# Patient Record
Sex: Female | Born: 1985 | Hispanic: No | Marital: Married | Smoking: Never smoker
Health system: Southern US, Community
[De-identification: ages and names within clinical notes are randomized; demographics above are authoritative.]

## PROBLEM LIST (undated history)

## (undated) ENCOUNTER — Inpatient Hospital Stay (HOSPITAL_COMMUNITY): Payer: Self-pay

## (undated) DIAGNOSIS — D649 Anemia, unspecified: Secondary | ICD-10-CM

## (undated) DIAGNOSIS — F32A Depression, unspecified: Secondary | ICD-10-CM

## (undated) DIAGNOSIS — F329 Major depressive disorder, single episode, unspecified: Secondary | ICD-10-CM

## (undated) DIAGNOSIS — S99919A Unspecified injury of unspecified ankle, initial encounter: Secondary | ICD-10-CM

## (undated) HISTORY — PX: DILATION AND CURETTAGE OF UTERUS: SHX78

---
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
## 2007-01-26 ENCOUNTER — Ambulatory Visit: Payer: Self-pay | Admitting: *Deleted

## 2007-01-26 ENCOUNTER — Inpatient Hospital Stay (HOSPITAL_COMMUNITY): Admission: AD | Admit: 2007-01-26 | Discharge: 2007-01-26 | Payer: Self-pay | Admitting: Obstetrics & Gynecology

## 2007-01-29 ENCOUNTER — Ambulatory Visit: Payer: Self-pay | Admitting: *Deleted

## 2007-01-30 ENCOUNTER — Ambulatory Visit (HOSPITAL_COMMUNITY): Admission: RE | Admit: 2007-01-30 | Discharge: 2007-01-30 | Payer: Self-pay | Admitting: Family Medicine

## 2007-02-11 ENCOUNTER — Ambulatory Visit: Payer: Self-pay | Admitting: Obstetrics & Gynecology

## 2007-02-19 ENCOUNTER — Ambulatory Visit: Payer: Self-pay | Admitting: Family Medicine

## 2007-02-25 ENCOUNTER — Ambulatory Visit: Payer: Self-pay | Admitting: *Deleted

## 2007-03-11 ENCOUNTER — Ambulatory Visit: Payer: Self-pay | Admitting: *Deleted

## 2007-03-13 ENCOUNTER — Ambulatory Visit: Payer: Self-pay | Admitting: Gynecology

## 2007-03-18 ENCOUNTER — Ambulatory Visit: Payer: Self-pay | Admitting: Family Medicine

## 2007-03-18 ENCOUNTER — Inpatient Hospital Stay (HOSPITAL_COMMUNITY): Admission: AD | Admit: 2007-03-18 | Discharge: 2007-03-20 | Payer: Self-pay | Admitting: Family Medicine

## 2008-09-02 ENCOUNTER — Emergency Department (HOSPITAL_COMMUNITY): Admission: EM | Admit: 2008-09-02 | Discharge: 2008-09-02 | Payer: Self-pay | Admitting: Emergency Medicine

## 2009-02-07 ENCOUNTER — Emergency Department (HOSPITAL_COMMUNITY): Admission: EM | Admit: 2009-02-07 | Discharge: 2009-02-08 | Payer: Self-pay | Admitting: Emergency Medicine

## 2009-08-09 ENCOUNTER — Ambulatory Visit: Payer: Self-pay | Admitting: Physician Assistant

## 2009-08-09 ENCOUNTER — Inpatient Hospital Stay (HOSPITAL_COMMUNITY): Admission: AD | Admit: 2009-08-09 | Discharge: 2009-08-10 | Payer: Self-pay | Admitting: Obstetrics and Gynecology

## 2009-11-06 ENCOUNTER — Emergency Department (HOSPITAL_COMMUNITY): Admission: EM | Admit: 2009-11-06 | Discharge: 2009-11-06 | Payer: Self-pay | Admitting: Emergency Medicine

## 2010-04-25 ENCOUNTER — Inpatient Hospital Stay (HOSPITAL_COMMUNITY): Admission: RE | Admit: 2010-04-25 | Discharge: 2010-04-25 | Payer: Self-pay | Admitting: Obstetrics & Gynecology

## 2010-04-25 ENCOUNTER — Ambulatory Visit: Payer: Self-pay | Admitting: Obstetrics and Gynecology

## 2010-07-15 ENCOUNTER — Emergency Department (HOSPITAL_COMMUNITY)
Admission: EM | Admit: 2010-07-15 | Discharge: 2010-07-15 | Payer: Self-pay | Source: Home / Self Care | Admitting: Emergency Medicine

## 2010-07-15 LAB — RPR: RPR Ser Ql: NONREACTIVE

## 2010-07-15 LAB — URINALYSIS, ROUTINE W REFLEX MICROSCOPIC
Bilirubin Urine: NEGATIVE
Ketones, ur: NEGATIVE mg/dL
Nitrite: NEGATIVE
Urobilinogen, UA: 1 mg/dL (ref 0.0–1.0)
pH: 7 (ref 5.0–8.0)

## 2010-07-15 LAB — WET PREP, GENITAL

## 2010-07-15 LAB — POCT PREGNANCY, URINE: Preg Test, Ur: NEGATIVE

## 2010-07-16 LAB — GC/CHLAMYDIA PROBE AMP, GENITAL
Chlamydia, DNA Probe: NEGATIVE
GC Probe Amp, Genital: NEGATIVE

## 2010-08-28 LAB — URINALYSIS, ROUTINE W REFLEX MICROSCOPIC
Bilirubin Urine: NEGATIVE
Ketones, ur: NEGATIVE mg/dL
Nitrite: NEGATIVE
Protein, ur: NEGATIVE mg/dL
Urobilinogen, UA: 0.2 mg/dL (ref 0.0–1.0)

## 2010-08-28 LAB — URINE MICROSCOPIC-ADD ON

## 2010-09-03 LAB — URINALYSIS, ROUTINE W REFLEX MICROSCOPIC
Ketones, ur: NEGATIVE mg/dL
Nitrite: NEGATIVE
Specific Gravity, Urine: 1.024 (ref 1.005–1.030)
pH: 6 (ref 5.0–8.0)

## 2010-09-03 LAB — URINE MICROSCOPIC-ADD ON

## 2010-09-03 LAB — WET PREP, GENITAL: Trich, Wet Prep: NONE SEEN

## 2010-09-03 LAB — PREGNANCY, URINE: Preg Test, Ur: NEGATIVE

## 2010-09-03 LAB — GC/CHLAMYDIA PROBE AMP, GENITAL: Chlamydia, DNA Probe: NEGATIVE

## 2010-09-05 LAB — WET PREP, GENITAL: Clue Cells Wet Prep HPF POC: NONE SEEN

## 2010-09-05 LAB — URINALYSIS, ROUTINE W REFLEX MICROSCOPIC
Glucose, UA: NEGATIVE mg/dL
Leukocytes, UA: NEGATIVE
pH: 6.5 (ref 5.0–8.0)

## 2010-09-05 LAB — URINE CULTURE

## 2010-09-05 LAB — URINE MICROSCOPIC-ADD ON

## 2010-09-22 LAB — URINE MICROSCOPIC-ADD ON

## 2010-09-22 LAB — WET PREP, GENITAL
Trich, Wet Prep: NONE SEEN
WBC, Wet Prep HPF POC: NONE SEEN
Yeast Wet Prep HPF POC: NONE SEEN

## 2010-09-22 LAB — URINALYSIS, ROUTINE W REFLEX MICROSCOPIC
Nitrite: NEGATIVE
Specific Gravity, Urine: 1.033 — ABNORMAL HIGH (ref 1.005–1.030)
Urobilinogen, UA: 1 mg/dL (ref 0.0–1.0)

## 2010-09-22 LAB — POCT PREGNANCY, URINE: Preg Test, Ur: NEGATIVE

## 2010-09-27 LAB — POCT PREGNANCY, URINE: Preg Test, Ur: NEGATIVE

## 2011-03-28 LAB — POCT URINALYSIS DIP (DEVICE)
Bilirubin Urine: NEGATIVE
Glucose, UA: NEGATIVE
Hgb urine dipstick: NEGATIVE
Ketones, ur: NEGATIVE
Operator id: 297281
Specific Gravity, Urine: 1.025

## 2011-03-28 LAB — CBC
Platelets: 248
RDW: 20 — ABNORMAL HIGH
WBC: 17.1 — ABNORMAL HIGH

## 2011-03-28 LAB — VITAMIN B12: Vitamin B-12: 371 (ref 211–911)

## 2011-03-28 LAB — HEMOGLOBINOPATHY EVALUATION: Hgb A2 Quant: 2.2

## 2011-03-28 LAB — FERRITIN: Ferritin: 5 — ABNORMAL LOW (ref 10–291)

## 2011-04-01 LAB — URINALYSIS, ROUTINE W REFLEX MICROSCOPIC
Glucose, UA: NEGATIVE
Protein, ur: NEGATIVE
Specific Gravity, Urine: >1.03 — ABNORMAL HIGH
pH: 6

## 2011-04-01 LAB — GC/CHLAMYDIA PROBE AMP, GENITAL
Chlamydia, DNA Probe: NEGATIVE
GC Probe Amp, Genital: NEGATIVE

## 2011-04-01 LAB — URINE MICROSCOPIC-ADD ON

## 2011-04-01 LAB — POCT URINALYSIS DIP (DEVICE)
Hgb urine dipstick: NEGATIVE
Ketones, ur: 15 — AB
Nitrite: NEGATIVE
Protein, ur: 30 — AB
pH: 6.5

## 2011-04-01 LAB — WET PREP, GENITAL
Clue Cells Wet Prep HPF POC: NONE SEEN
Yeast Wet Prep HPF POC: NONE SEEN

## 2011-04-01 LAB — RAPID URINE DRUG SCREEN, HOSP PERFORMED: Benzodiazepines: NOT DETECTED

## 2011-05-19 ENCOUNTER — Emergency Department (HOSPITAL_COMMUNITY)
Admission: EM | Admit: 2011-05-19 | Discharge: 2011-05-19 | Disposition: A | Discharge status: Home / Self Care | Payer: No Typology Code available for payment source | Attending: Emergency Medicine | Admitting: Emergency Medicine

## 2011-05-19 ENCOUNTER — Encounter: Payer: Self-pay | Admitting: *Deleted

## 2011-05-19 ENCOUNTER — Emergency Department (HOSPITAL_COMMUNITY): Payer: No Typology Code available for payment source

## 2011-05-19 DIAGNOSIS — IMO0001 Reserved for inherently not codable concepts without codable children: Secondary | ICD-10-CM | POA: Insufficient documentation

## 2011-05-19 DIAGNOSIS — S39012A Strain of muscle, fascia and tendon of lower back, initial encounter: Secondary | ICD-10-CM

## 2011-05-19 DIAGNOSIS — M545 Low back pain, unspecified: Secondary | ICD-10-CM | POA: Insufficient documentation

## 2011-05-19 DIAGNOSIS — M2550 Pain in unspecified joint: Secondary | ICD-10-CM | POA: Insufficient documentation

## 2011-05-19 DIAGNOSIS — S139XXA Sprain of joints and ligaments of unspecified parts of neck, initial encounter: Secondary | ICD-10-CM | POA: Insufficient documentation

## 2011-05-19 DIAGNOSIS — S335XXA Sprain of ligaments of lumbar spine, initial encounter: Secondary | ICD-10-CM | POA: Insufficient documentation

## 2011-05-19 DIAGNOSIS — S161XXA Strain of muscle, fascia and tendon at neck level, initial encounter: Secondary | ICD-10-CM

## 2011-05-19 DIAGNOSIS — M542 Cervicalgia: Secondary | ICD-10-CM | POA: Insufficient documentation

## 2011-05-19 MED ORDER — IBUPROFEN 800 MG PO TABS: 800.0000 mg | ORAL_TABLET | Freq: Three times a day (TID) | ORAL | Status: DC

## 2011-05-19 MED ORDER — IBUPROFEN 800 MG PO TABS
800.0000 mg | ORAL_TABLET | Freq: Once | ORAL | Status: AC
  Administered 2011-05-19: 800 mg via ORAL
  Filled 2011-05-19: qty 1

## 2011-05-19 MED ORDER — HYDROCODONE-ACETAMINOPHEN 5-325 MG PO TABS
1.0000 | ORAL_TABLET | Freq: Once | ORAL | Status: AC
  Administered 2011-05-19: 1 via ORAL
  Filled 2011-05-19: qty 1

## 2011-05-19 MED ORDER — HYDROCODONE-ACETAMINOPHEN 5-325 MG PO TABS: 1.0000 | ORAL_TABLET | ORAL | Status: DC | PRN

## 2011-05-19 MED ORDER — DIAZEPAM 5 MG PO TABS: 5.0000 mg | ORAL_TABLET | Freq: Three times a day (TID) | ORAL | Status: DC | PRN

## 2011-05-19 NOTE — ED Notes (Signed)
MD at bedside. 
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
## 2011-05-19 NOTE — ED Notes (Signed)
Patient remains in radiology
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
## 2011-05-19 NOTE — ED Notes (Signed)
Also requesting preg test.

## 2011-05-19 NOTE — ED Notes (Signed)
Pt resting.  Waiting for radiology.
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
## 2011-05-19 NOTE — ED Notes (Signed)
Returned from radiology. 
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
## 2011-05-19 NOTE — ED Provider Notes (Signed)
Medical screening examination/treatment/procedure(s) were performed by non-physician practitioner and as supervising physician I was immediately available for consultation/collaboration.    Nelia Shi, MD 05/19/11 [phone]

## 2011-05-22 ENCOUNTER — Emergency Department (HOSPITAL_COMMUNITY)
Admission: EM | Admit: 2011-05-22 | Discharge: 2011-05-23 | Disposition: A | Discharge status: Home / Self Care | Payer: Self-pay | Attending: Emergency Medicine | Admitting: Emergency Medicine

## 2011-05-22 ENCOUNTER — Encounter (HOSPITAL_COMMUNITY): Payer: Self-pay | Admitting: Emergency Medicine

## 2011-05-22 DIAGNOSIS — R05 Cough: Secondary | ICD-10-CM | POA: Insufficient documentation

## 2011-05-22 DIAGNOSIS — R51 Headache: Secondary | ICD-10-CM | POA: Insufficient documentation

## 2011-05-22 DIAGNOSIS — IMO0001 Reserved for inherently not codable concepts without codable children: Secondary | ICD-10-CM | POA: Insufficient documentation

## 2011-05-22 DIAGNOSIS — H9209 Otalgia, unspecified ear: Secondary | ICD-10-CM | POA: Insufficient documentation

## 2011-05-22 DIAGNOSIS — J069 Acute upper respiratory infection, unspecified: Secondary | ICD-10-CM | POA: Insufficient documentation

## 2011-05-22 DIAGNOSIS — M549 Dorsalgia, unspecified: Secondary | ICD-10-CM | POA: Insufficient documentation

## 2011-05-22 DIAGNOSIS — R599 Enlarged lymph nodes, unspecified: Secondary | ICD-10-CM | POA: Insufficient documentation

## 2011-05-22 DIAGNOSIS — R059 Cough, unspecified: Secondary | ICD-10-CM | POA: Insufficient documentation

## 2011-05-22 DIAGNOSIS — R070 Pain in throat: Secondary | ICD-10-CM | POA: Insufficient documentation

## 2011-05-22 DIAGNOSIS — R509 Fever, unspecified: Secondary | ICD-10-CM | POA: Insufficient documentation

## 2011-05-22 NOTE — ED Notes (Signed)
Pt st's she started feeling bad last pm. With elevated temp. And body aches

## 2011-05-23 MED ORDER — AMOXICILLIN 500 MG PO CAPS: 500.0000 mg | ORAL_CAPSULE | Freq: Three times a day (TID) | ORAL | Status: AC

## 2011-05-23 MED ORDER — OXYCODONE-ACETAMINOPHEN 5-325 MG PO TABS: 2.0000 | ORAL_TABLET | ORAL | Status: AC | PRN

## 2011-05-23 NOTE — ED Notes (Signed)
PT ambulated with a steady gait; VSS; no acute signs of distress; pt education on infection control and contagiousness. No questions from pt.
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
## 2011-05-23 NOTE — ED Notes (Signed)
MD aware of abnormal vitals. No change in orders. PT to be discharge. Pt encouraged to take OTC fever reducers.

## 2011-06-18 NOTE — L&D Delivery Note (Signed)
Delivery Note At 3:55 PM a viable female was delivered via Vaginal, Spontaneous Delivery (Presentation: Left Occiput Anterior).  APGAR: 9, 9; weight .   Placenta status: Intact, Spontaneous.  Cord: 3 vessels with the following complications: None.    Anesthesia: Epidural  Episiotomy: None Lacerations: 1st degree;Perineal Suture Repair:hemostastsic, no repairEst. Blood Loss (mL):   Mom to postpartum.  Baby to nursery-stable.  Carlis Blanchard 04/25/2012, 4:12 PM

## 2011-08-28 ENCOUNTER — Inpatient Hospital Stay (HOSPITAL_COMMUNITY)
Admission: AD | Admit: 2011-08-28 | Discharge: 2011-08-28 | Disposition: A | Discharge status: Home / Self Care | Payer: Self-pay | Source: Ambulatory Visit | Attending: Obstetrics & Gynecology | Admitting: Obstetrics & Gynecology

## 2011-08-28 ENCOUNTER — Inpatient Hospital Stay (HOSPITAL_COMMUNITY): Payer: Self-pay

## 2011-08-28 ENCOUNTER — Encounter (HOSPITAL_COMMUNITY): Payer: Self-pay

## 2011-08-28 DIAGNOSIS — B9689 Other specified bacterial agents as the cause of diseases classified elsewhere: Secondary | ICD-10-CM | POA: Insufficient documentation

## 2011-08-28 DIAGNOSIS — B373 Candidiasis of vulva and vagina: Secondary | ICD-10-CM

## 2011-08-28 DIAGNOSIS — O2390 Unspecified genitourinary tract infection in pregnancy, unspecified trimester: Secondary | ICD-10-CM | POA: Insufficient documentation

## 2011-08-28 DIAGNOSIS — Z3201 Encounter for pregnancy test, result positive: Secondary | ICD-10-CM

## 2011-08-28 DIAGNOSIS — Z8619 Personal history of other infectious and parasitic diseases: Secondary | ICD-10-CM | POA: Clinically undetermined

## 2011-08-28 DIAGNOSIS — N760 Acute vaginitis: Secondary | ICD-10-CM | POA: Insufficient documentation

## 2011-08-28 DIAGNOSIS — R109 Unspecified abdominal pain: Secondary | ICD-10-CM | POA: Insufficient documentation

## 2011-08-28 DIAGNOSIS — A499 Bacterial infection, unspecified: Secondary | ICD-10-CM | POA: Insufficient documentation

## 2011-08-28 HISTORY — DX: Depression, unspecified: F32.A

## 2011-08-28 HISTORY — DX: Major depressive disorder, single episode, unspecified: F32.9

## 2011-08-28 LAB — URINE MICROSCOPIC-ADD ON

## 2011-08-28 LAB — CBC
HCT: 33.6 % — ABNORMAL LOW (ref 36.0–46.0)
MCH: 27.3 pg (ref 26.0–34.0)
MCV: 84.8 fL (ref 78.0–100.0)
Platelets: 313 10*3/uL (ref 150–400)
RDW: 14.6 % (ref 11.5–15.5)

## 2011-08-28 LAB — URINALYSIS, ROUTINE W REFLEX MICROSCOPIC
Glucose, UA: NEGATIVE mg/dL
Nitrite: NEGATIVE
Specific Gravity, Urine: 1.01 (ref 1.005–1.030)
pH: 6.5 (ref 5.0–8.0)

## 2011-08-28 LAB — POCT PREGNANCY, URINE: Preg Test, Ur: POSITIVE — AB

## 2011-08-28 LAB — WET PREP, GENITAL
Trich, Wet Prep: NONE SEEN
Yeast Wet Prep HPF POC: NONE SEEN

## 2011-08-28 MED ORDER — NYSTATIN 100000 UNIT/GM EX CREA
TOPICAL_CREAM | Freq: Two times a day (BID) | CUTANEOUS | Status: DC
  Administered 2011-08-28: 20:00:00 via TOPICAL
  Filled 2011-08-28: qty 15

## 2011-08-28 MED ORDER — NYSTATIN-TRIAMCINOLONE 100000-0.1 UNIT/GM-% EX CREA: TOPICAL_CREAM | Freq: Two times a day (BID) | CUTANEOUS | Status: DC

## 2011-08-28 MED ORDER — METRONIDAZOLE 500 MG PO TABS: 2000.0000 mg | ORAL_TABLET | Freq: Once | ORAL | Status: AC

## 2011-08-28 MED ORDER — TERCONAZOLE 0.4 % VA CREA: 1.0000 | TOPICAL_CREAM | Freq: Every day | VAGINAL | Status: AC

## 2011-08-28 MED ORDER — TRIAMCINOLONE ACETONIDE 0.1 % EX CREA
TOPICAL_CREAM | Freq: Two times a day (BID) | CUTANEOUS | Status: DC
  Administered 2011-08-28: 20:00:00 via TOPICAL
  Filled 2011-08-28: qty 15

## 2011-08-28 NOTE — MAU Note (Signed)
Pt states lmp-07/22/2011, has been nauseated, notes intermittent abdominal pain. White vaginal d/c, feels irritated. Was recently treated for chlamydia 2 weeks ago, partner was treated. Vagina feels irritated, and slightly swollen.

## 2011-08-28 NOTE — Discharge Instructions (Signed)
Bacterial Vaginosis Bacterial vaginosis (BV) is a vaginal infection where the normal balance of bacteria in the vagina is disrupted. The normal balance is then replaced by an overgrowth of certain bacteria. There are several different kinds of bacteria that can cause BV. BV is the most common vaginal infection in women of childbearing age. CAUSES   The cause of BV is not fully understood. BV develops when there is an increase or imbalance of harmful bacteria.   Some activities or behaviors can upset the normal balance of bacteria in the vagina and put women at increased risk including:   Having a new sex partner or multiple sex partners.   Douching.   Using an intrauterine device (IUD) for contraception.   It is not clear what role sexual activity plays in the development of BV. However, women that have never had sexual intercourse are rarely infected with BV.  Women do not get BV from toilet seats, bedding, swimming pools or from touching objects around them.  SYMPTOMS   Grey vaginal discharge.   A fish-like odor with discharge, especially after sexual intercourse.   Itching or burning of the vagina and vulva.   Burning or pain with urination.   Some women have no signs or symptoms at all.  DIAGNOSIS  Your caregiver must examine the vagina for signs of BV. Your caregiver will perform lab tests and look at the sample of vaginal fluid through a microscope. They will look for bacteria and abnormal cells (clue cells), a pH test higher than 4.5, and a positive amine test all associated with BV.  RISKS AND COMPLICATIONS   Pelvic inflammatory disease (PID).   Infections following gynecology surgery.   Developing HIV.   Developing herpes virus.  TREATMENT  Sometimes BV will clear up without treatment. However, all women with symptoms of BV should be treated to avoid complications, especially if gynecology surgery is planned. Female partners generally do not need to be treated. However,  BV may spread between female sex partners so treatment is helpful in preventing a recurrence of BV.   BV may be treated with antibiotics. The antibiotics come in either pill or vaginal cream forms. Either can be used with nonpregnant or pregnant women, but the recommended dosages differ. These antibiotics are not harmful to the baby.   BV can recur after treatment. If this happens, a second round of antibiotics will often be prescribed.   Treatment is important for pregnant women. If not treated, BV can cause a premature delivery, especially for a pregnant woman who had a premature birth in the past. All pregnant women who have symptoms of BV should be checked and treated.   For chronic reoccurrence of BV, treatment with a type of prescribed gel vaginally twice a week is helpful.  HOME CARE INSTRUCTIONS   Finish all medication as directed by your caregiver.   Do not have sex until treatment is completed.   Tell your sexual partner that you have a vaginal infection. They should see their caregiver and be treated if they have problems, such as a mild rash or itching.   Practice safe sex. Use condoms. Only have 1 sex partner.  PREVENTION  Basic prevention steps can help reduce the risk of upsetting the natural balance of bacteria in the vagina and developing BV:  Do not have sexual intercourse (be abstinent).   Do not douche.   Use all of the medicine prescribed for treatment of BV, even if the signs and symptoms go away.     Tell your sex partner if you have BV. That way, they can be treated, if needed, to prevent reoccurrence.  SEEK MEDICAL CARE IF:   Your symptoms are not improving after 3 days of treatment.   You have increased discharge, pain, or fever.  MAKE SURE YOU:   Understand these instructions.   Will watch your condition.   Will get help right away if you are not doing well or get worse.  FOR MORE INFORMATION  Division of STD Prevention (DSTDP), Centers for Disease  Control and Prevention: SolutionApps.co.za American [employer] Health Association ([place]): www.ashastd.org  Document Released: 06/03/2005 Document Revised: 05/23/2011 Document Reviewed: 11/24/2008 [facility] Patient Information 2012 [place], [place].Candida Infection, Adult A candida infection (also called yeast, fungus and Monilia infection) is an overgrowth of yeast that can occur anywhere on the body. A yeast infection commonly occurs in warm, moist body areas. Usually, the infection remains localized but can spread to become a systemic infection. A yeast infection may be a sign of a more severe disease such as diabetes, leukemia, or AIDS. A yeast infection can occur in both men and women. In women, Candida vaginitis is a vaginal infection. It is one of the most common causes of vaginitis. Men usually do not have symptoms or know they have an infection until other problems develop. Men may find out they have a yeast infection because their sex partner has a yeast infection. Uncircumcised men are more likely to get a yeast infection than circumcised men. This is because the uncircumcised glans is not exposed to air and does not remain as dry as that of a circumcised glans. Older adults may develop yeast infections around dentures. CAUSES  Women  Antibiotics.   Steroid medication taken for a long time.   Being overweight (obese).   Diabetes.   Poor immune condition.   Certain serious medical conditions.   Immune suppressive medications for organ transplant patients.   Chemotherapy.   Pregnancy.   Menstration.   Stress and fatigue.   Intravenous drug use.   Oral contraceptives.   Wearing tight-fitting clothes in the crotch area.   Catching it from a sex partner who has a yeast infection.   Spermicide.   Intravenous, urinary, or other catheters.  Men  Catching it from a sex partner who has a yeast infection.   Having oral or anal sex with a person who has the infection.    Spermicide.   Diabetes.   Antibiotics.   Poor immune system.   Medications that suppress the immune system.   Intravenous drug use.   Intravenous, urinary, or other catheters.  SYMPTOMS  Women  Thick, white vaginal discharge.   Vaginal itching.   Redness and swelling in and around the vagina.   Irritation of the lips of the vagina and perineum.   Blisters on the vaginal lips and perineum.   Painful sexual intercourse.   Low blood sugar (hypoglycemia).   Painful urination.   Bladder infections.   Intestinal problems such as constipation, indigestion, bad breath, bloating, increase in gas, diarrhea, or loose stools.  Men  Men may develop intestinal problems such as constipation, indigestion, bad breath, bloating, increase in gas, diarrhea, or loose stools.   Dry, cracked skin on the penis with itching or discomfort.   Jock itch.   Dry, flaky skin.   Athlete's foot.   Hypoglycemia.  DIAGNOSIS  Women  A history and an exam are performed.   The discharge may be examined under a microscope.  A culture may be taken of the discharge.  Men  A history and an exam are performed.   Any discharge from the penis or areas of cracked skin will be looked at under the microscope and cultured.   Stool samples may be cultured.  TREATMENT  Women  Vaginal antifungal suppositories and creams.   Medicated creams to decrease irritation and itching on the outside of the vagina.   Warm compresses to the perineal area to decrease swelling and discomfort.   Oral antifungal medications.   Medicated vaginal suppositories or cream for repeated or recurrent infections.   Wash and dry the irritation areas before applying the cream.   Eating yogurt with lactobacillus may help with prevention and treatment.   Sometimes painting the vagina with gentian violet solution may help if creams and suppositories do not work.  Men  Antifungal creams and oral antifungal  medications.   Sometimes treatment must continue for 30 days after the symptoms go away to prevent recurrence.  HOME CARE INSTRUCTIONS  Women  Use cotton underwear and avoid tight-fitting clothing.   Avoid colored, scented toilet paper and deodorant tampons or pads.   Do not douche.   Keep your diabetes under control.   Finish all the prescribed medications.   Keep your skin clean and dry.   Consume milk or yogurt with lactobacillus active culture regularly. If you get frequent yeast infections and think that is what the infection is, there are over-the-counter medications that you can get. If the infection does not show healing in 3 days, talk to your caregiver.   Tell your sex partner you have a yeast infection. Your partner may need treatment also, especially if your infection does not clear up or recurs.  Men  Keep your skin clean and dry.   Keep your diabetes under control.   Finish all prescribed medications.   Tell your sex partner that you have a yeast infection so they can be treated if necessary.  SEEK MEDICAL CARE IF:   Your symptoms do not clear up or worsen in one week after treatment.   You have an oral temperature above 102 F (38.9 C).   You have trouble swallowing or eating for a prolonged time.   You develop blisters on and around your vagina.   You develop vaginal bleeding and it is not your menstrual period.   You develop abdominal pain.   You develop intestinal problems as mentioned above.   You get weak or lightheaded.   You have painful or increased urination.   You have pain during sexual intercourse.  MAKE SURE YOU:   Understand these instructions.   Will watch your condition.   Will get help right away if you are not doing well or get worse.  Document Released: 07/11/2004 Document Revised: 05/23/2011 Document Reviewed: 10/23/2009 [facility] Patient Information 2012 [place], [place].

## 2011-08-28 NOTE — MAU Note (Signed)
Patient states she has missed her period and thinks she might be pregnancy. Has been having a vaginal discharge with itching for 4-5 days, lot of vaginal irritation. Has some lower abdominal pain the comes and goes.

## 2011-08-28 NOTE — MAU Provider Note (Signed)
  History     CSN: [id number]  Arrival date and time: 08/28/11 1540   First Provider Initiated Contact with Patient 08/28/11 1701      Chief Complaint  Patient presents with  . Vaginal Discharge  . Abdominal Pain   HPI This is a 26 y.o. at [redacted]w[redacted]d who presents with c/o cramping for several days and spotting yesterday. Was treated for Chlamydia 2 weeks ago at [facility]. Was not sure if she was pregnant. No fever, nausea, vomiting or diarrhea.  OB History    Grav Para Term Preterm Abortions TAB SAB Ect Mult Living   7 2 2  4 4    2       Past Medical History  Diagnosis Date  . Depression     Past Surgical History  Procedure Date  . Dilation and curettage of uterus     Family History  Problem Relation Age of Onset  . Anesthesia problems Neg Hx   . Hypotension Neg Hx   . Malignant hyperthermia Neg Hx   . Pseudochol deficiency Neg Hx     History  Substance Use Topics  . Smoking status: Current Everyday Smoker    Types: Cigars  . Smokeless tobacco: Never Used  . Alcohol Use: No     occ    Allergies: No Known Allergies  Prescriptions prior to admission  Medication Sig Dispense Refill  . Multiple Vitamins-Minerals (MULTIVITAMIN GUMMIES ADULT) CHEW Chew 2 tablets by mouth daily.        ROS As above   Physical Exam   Blood pressure 125/80, pulse 76, temperature 100.1 F (37.8 C), temperature source Oral, resp. rate 16, height 5\' 3"  (1.6 m), weight 189 lb 3.2 oz (85.821 kg), last menstrual period 07/22/2011, SpO2 100.00%.  Physical Exam  Constitutional: She is oriented to person, place, and time. She appears well-developed and well-nourished. No distress.  HENT:  Head: Normocephalic.  Cardiovascular: Normal rate.   Respiratory: Effort normal.  GI: Soft. She exhibits no distension and no mass. There is tenderness (suprapubic). There is no rebound and no guarding.  Genitourinary: Uterus normal. Vaginal discharge (curdlike,, white, no blood in vault) found.   Uterus small, tender  Musculoskeletal: Normal range of motion.  Neurological: She is alert and oriented to person, place, and time.  Skin: Skin is warm and dry.  Psychiatric: She has a normal mood and affect.    MAU Course  Procedures  MDM Will check quant, ABO/RH, CBC and [place]. Will check wet prep but not cultures since it has only been 2 weeks since treatment.  Assessment and Plan  A:  Intrauterine gestational sac at 5.0 weeks, no yolk sac or fp, so cannot exclude ectopic yet      Mild BV      Neg wet prep for yeast, but based on symptoms and appearance of discharge, assume yeast P:  Repeat quant in 2 days, repeat US in 7- 10 days per Radiologist      Will treat BV and yeast  [facility] 08/28/2011, 5:07 PM

## 2011-08-29 NOTE — MAU Provider Note (Signed)
Attestation of Attending Supervision of Advanced Practitioner: Evaluation and management procedures were performed by the PA/NP/CNM/OB Fellow under my supervision/collaboration. Chart reviewed, and agree with management and plan.  Jaynie Collins, M.D. 08/29/2011 9:01 AM

## 2011-09-04 ENCOUNTER — Encounter (HOSPITAL_COMMUNITY): Payer: Self-pay

## 2011-09-04 ENCOUNTER — Inpatient Hospital Stay (HOSPITAL_COMMUNITY)
Admission: AD | Admit: 2011-09-04 | Discharge: 2011-09-04 | Disposition: A | Discharge status: Home / Self Care | Payer: Self-pay | Source: Ambulatory Visit | Attending: Obstetrics and Gynecology | Admitting: Obstetrics and Gynecology

## 2011-09-04 ENCOUNTER — Inpatient Hospital Stay (HOSPITAL_COMMUNITY): Payer: Self-pay

## 2011-09-04 DIAGNOSIS — Z8619 Personal history of other infectious and parasitic diseases: Secondary | ICD-10-CM

## 2011-09-04 DIAGNOSIS — O30009 Twin pregnancy, unspecified number of placenta and unspecified number of amniotic sacs, unspecified trimester: Secondary | ICD-10-CM | POA: Insufficient documentation

## 2011-09-04 DIAGNOSIS — O99891 Other specified diseases and conditions complicating pregnancy: Secondary | ICD-10-CM | POA: Insufficient documentation

## 2011-09-04 NOTE — Discharge Instructions (Signed)
Multiple Pregnancy A multiple pregnancy is when a woman is pregnant with two or more fetuses. Multiple pregnancies occur in about 3% of all births. The more babies in a pregnancy, the greater the risks of problems to the babies and mother. This includes death. Since the use of Assisted Reproductive Technology (ART) and medications that can induce ovulation, multiple fetal gestation has increased.  RISKS TO THE MOTHER  Preeclampsia and eclampsia.   Postpartum bleeding (hemorrhage).   Kidney infection (pyelonephritis).   Develop anemia.   Develop diabetes.   Liver complications.   A blood clot blocks the artery, or branch of the artery leading to the lungs (pulmonary embolism).   Blood clots in the leg.   Placental separation.   Higher rate of Cesarean Section deliveries.   Women over 22 years old have a higher rate of Downs Syndrome babies.  RISKS TO THE BABY  Preterm labor with a premature baby.   Very low birth weight babies that are less than 3 pounds, especially with triplets or mores.   Premature rupture of the membranes.   Twin to twin blood transfusion with one baby anemic and the other baby with too much blood in its system. There may also be heart failure.   With triplets or more, one of the babies is at high risk for cerebral palsy or other neurologic problem.   There is a higher incidence of fetal death.  CARE OF MOTHERS WITH MULTIPLE FETAL GESTATION Multiple pregnancies need more care and special prenatal care.  You will see your caregiver more often.   You will have more tests including ultrasounds, nonstress tests and blood tests.   You will have special tests done called amniocentesis and a biophysical profile.   You may be hospitalized more often during the pregnancy.   You will be encouraged to eat a balanced and healthy diet with vitamin and mineral supplements as directed.   You will be asked to get more rest and sleep to keep up your energy.    You will be asked to restrict your daily activities, exercise, work, household chores and sexual activity.   If you have preterm labor with small babies, you will be given a steroid injection to help the babies lungs mature and do better when born.   The delivery may have to be by Cesarean delivery, especially if there are triplets or more.   The delivery should be in a hospital with an intensive care nursery and Neonatologists (pediatrician for high risk babies) to care for the newborn babies.  HOME CARE INSTRUCTIONS   Follow the caregiver's recommendations regarding office visits, tests for you and the babies, diet, rest and medications.   Avoid a large amount of physical activity.   Arrange to have help after the babies are born and when you go home from the hospital.   Take classes on how to care for multiple babies before you deliver them.  SEEK IMMEDIATE MEDICAL CARE IF:   You develop a temperature of 102 F (38.9 C) or higher.   You are leaking fluid from the vagina.   You develop vaginal bleeding.   You develop uterine contractions.   You develop a severe headache, severe upper abdominal pain, visual problems or excessive swelling of your face, hands and feet.   You develop severe back pain or leg pain.   You develop severe tiredness.   You develop chest pain.   You have shortness of breath, fall down or pass out.  well or get worse.  Document Released: 03/12/2008 Document Revised: 05/23/2011 Document Reviewed: 03/12/2008 [employer] Patient Information 2012 [employer]. 

## 2011-09-04 NOTE — MAU Provider Note (Signed)
History   Pt presents today for repeat B-quant and [place] to confirm IUP. She denies any bleeding or pain at this time. She states she does continue to have slight cramping sensations that "come and go."  CSN: [id number]  Arrival date and time: 09/04/11 1103   None     Chief Complaint  Patient presents with  . Pregnancy Ultrasound   HPI  OB History    Grav Para Term Preterm Abortions TAB SAB Ect Mult Living   7 2 2  4 4    2       Past Medical History  Diagnosis Date  . Depression     Past Surgical History  Procedure Date  . Dilation and curettage of uterus     Family History  Problem Relation Age of Onset  . Anesthesia problems Neg Hx   . Hypotension Neg Hx   . Malignant hyperthermia Neg Hx   . Pseudochol deficiency Neg Hx     History  Substance Use Topics  . Smoking status: Current Everyday Smoker    Types: Cigars  . Smokeless tobacco: Never Used  . Alcohol Use: No     occ    Allergies: No Known Allergies  Prescriptions prior to admission  Medication Sig Dispense Refill  . metroNIDAZOLE (FLAGYL) 500 MG tablet Take 4 tablets (2,000 mg total) by mouth once.  4 tablet  0  . Multiple Vitamins-Minerals (MULTIVITAMIN GUMMIES ADULT) CHEW Chew 2 tablets by mouth daily.      Marland Kitchen terconazole (TERAZOL 7) 0.4 % vaginal cream Place 1 applicator vaginally at bedtime.  45 g  0    Review of Systems  Constitutional: Negative for fever and chills.  Eyes: Negative for blurred vision and double vision.  Respiratory: Negative for cough, hemoptysis, sputum production, shortness of breath and wheezing.   Cardiovascular: Negative for chest pain and palpitations.  Gastrointestinal: Negative for nausea, vomiting, abdominal pain, diarrhea and constipation.  Genitourinary: Negative for dysuria, urgency, frequency and hematuria.  Neurological: Negative for dizziness and headaches.  Psychiatric/Behavioral: Negative for depression and suicidal ideas.   Physical Exam   Blood pressure  122/72, pulse 102, temperature 98.3 F (36.8 C), temperature source Oral, resp. rate 16, height 5\' 3"  (1.6 m), weight 190 lb (86.183 kg), last menstrual period 07/22/2011, SpO2 100.00%.  Physical Exam  Nursing note and vitals reviewed. Constitutional: She is oriented to person, place, and time. She appears well-developed and well-nourished. No distress.  HENT:  Head: Normocephalic and atraumatic.  Eyes: EOM are normal. Pupils are equal, round, and reactive to light.  GI: Soft. She exhibits no distension. There is no tenderness. There is no rebound and no guarding.  Neurological: She is alert and oriented to person, place, and time.  Skin: Skin is warm and dry. She is not diaphoretic.  Psychiatric: She has a normal mood and affect. Her behavior is normal. Judgment and thought content normal.    MAU Course  Procedures  Results for orders placed during the hospital encounter of 09/04/11 (from the past 72 hour(s))  HCG, QUANTITATIVE, PREGNANCY     Status: Abnormal   Collection Time   09/04/11 11:40 AM      Component Value Range Comment   hCG, Beta Chain, Quant, S 30140 (*) <5 (mIU/mL)    US shows dichorionic, diamniotic intrauterine twin preg. Fetus A measures 6.2 wks with cardiac activity. Gestational sac B has a yolk sac but no visible embryo at this time.  Assessment and Plan  Twin preg:  discussed with pt at length. She will return in 2wks for repeat US to confirm twin viability. Discussed diet, activity, risks, and precautions.  Clinton Gallant. Shareena Nusz III, DrHSc, MPAS, PA-C  09/04/2011, 1:31 PM

## 2011-09-04 NOTE — MAU Note (Signed)
Pt states here for repeat bhcg and [place], denies pain or bleeding. Was to return on 08/30/2011, however was unable to.

## 2011-09-05 NOTE — MAU Provider Note (Signed)
Agree with above note.  Lauren Hayden 09/05/2011 8:59 AM

## 2011-09-18 ENCOUNTER — Inpatient Hospital Stay (HOSPITAL_COMMUNITY): Payer: Self-pay

## 2011-09-18 ENCOUNTER — Inpatient Hospital Stay (HOSPITAL_COMMUNITY)
Admission: AD | Admit: 2011-09-18 | Discharge: 2011-09-18 | Disposition: A | Discharge status: Home / Self Care | Payer: Self-pay | Source: Ambulatory Visit | Attending: Obstetrics & Gynecology | Admitting: Obstetrics & Gynecology

## 2011-09-18 DIAGNOSIS — O99891 Other specified diseases and conditions complicating pregnancy: Secondary | ICD-10-CM | POA: Insufficient documentation

## 2011-09-18 DIAGNOSIS — Z3490 Encounter for supervision of normal pregnancy, unspecified, unspecified trimester: Secondary | ICD-10-CM

## 2011-09-18 DIAGNOSIS — Z8619 Personal history of other infectious and parasitic diseases: Secondary | ICD-10-CM

## 2011-09-18 DIAGNOSIS — Z1389 Encounter for screening for other disorder: Secondary | ICD-10-CM

## 2011-09-18 NOTE — MAU Note (Signed)
Patient to MAU for a follow up ultrasound to confirm twin gestation. Patient states she has some cramping, no bleeding or discharge.

## 2011-09-18 NOTE — MAU Provider Note (Signed)
  History     CSN: [id number]  Arrival date and time: 09/18/11 1113   First Provider Initiated Contact with Patient 09/18/11 1243  Client not in lobby when provider called her.    Chief Complaint  Patient presents with  . Follow-up   HPI Lauren Hayden 26 y.o. 8w 2d gestation.  Returns today for repeat ultrasound.  Identified 2 gestational sacs at ultrasound 2 weeks ago.  One with fetal pole and one with just a yolk sac.  OB History    Grav Para Term Preterm Abortions TAB SAB Ect Mult Living   7 2 2  4 4    2       Past Medical History  Diagnosis Date  . Depression     Past Surgical History  Procedure Date  . Dilation and curettage of uterus     Family History  Problem Relation Age of Onset  . Anesthesia problems Neg Hx   . Hypotension Neg Hx   . Malignant hyperthermia Neg Hx   . Pseudochol deficiency Neg Hx     History  Substance Use Topics  . Smoking status: Current Everyday Smoker    Types: Cigars  . Smokeless tobacco: Never Used  . Alcohol Use: No     occ    Allergies: No Known Allergies  Prescriptions prior to admission  Medication Sig Dispense Refill  . Multiple Vitamins-Minerals (MULTIVITAMIN GUMMIES ADULT) CHEW Chew 2 tablets by mouth daily.        Review of Systems  Gastrointestinal: Negative for abdominal pain.  Genitourinary:       No vaginal bleeding   Physical Exam   Blood pressure 129/63, pulse 78, temperature 98.6 F (37 C), temperature source Oral, resp. rate 16, last menstrual period 07/22/2011, SpO2 100.00%.  Physical Exam  Nursing note and vitals reviewed. Constitutional: She is oriented to person, place, and time. She appears well-developed and well-nourished.  HENT:  Head: Normocephalic.  Eyes: EOM are normal.  Musculoskeletal: Normal range of motion.  Neurological: She is alert and oriented to person, place, and time.  Skin: Skin is warm and dry.  Psychiatric: She has a normal mood and affect.    MAU Course   Procedures Ultrasound - Progression of Fetus A appropriate growth, Second sac continues to only have yolk sac.  MDM 1245  Consult with Dr. Debroah Loop re: plan of care - likely will be a single baby, begin prenatal care  Assessment and Plan  Pregnancy 8w 2d   Plan Begin prenatal care Client not in lobby to discuss ultrasound findings.  Will discuss if she returns. 1330  Client in lobby.  Reviewed findings of ultrasound Advised to begin prenatal care as soon as possible. Plans to apply for Medicaid today - has pregnancy verification form. Given list of providers for prenatal care.   Lauren Hayden 09/18/2011, 12:43 PM

## 2011-09-26 NOTE — MAU Provider Note (Signed)
Agree with note. 

## 2011-10-09 ENCOUNTER — Inpatient Hospital Stay (HOSPITAL_COMMUNITY)
Admission: AD | Admit: 2011-10-09 | Discharge: 2011-10-09 | Disposition: A | Discharge status: Home / Self Care | Payer: Self-pay | Source: Ambulatory Visit | Attending: Obstetrics and Gynecology | Admitting: Obstetrics and Gynecology

## 2011-10-09 ENCOUNTER — Encounter (HOSPITAL_COMMUNITY): Payer: Self-pay | Admitting: *Deleted

## 2011-10-09 DIAGNOSIS — O99891 Other specified diseases and conditions complicating pregnancy: Secondary | ICD-10-CM | POA: Insufficient documentation

## 2011-10-09 DIAGNOSIS — O26899 Other specified pregnancy related conditions, unspecified trimester: Secondary | ICD-10-CM

## 2011-10-09 DIAGNOSIS — R109 Unspecified abdominal pain: Secondary | ICD-10-CM | POA: Insufficient documentation

## 2011-10-09 LAB — URINALYSIS, ROUTINE W REFLEX MICROSCOPIC
Bilirubin Urine: NEGATIVE
Glucose, UA: NEGATIVE mg/dL
Protein, ur: NEGATIVE mg/dL
Specific Gravity, Urine: 1.015 (ref 1.005–1.030)
Urobilinogen, UA: 0.2 mg/dL (ref 0.0–1.0)

## 2011-10-09 LAB — WET PREP, GENITAL
Clue Cells Wet Prep HPF POC: NONE SEEN
Trich, Wet Prep: NONE SEEN

## 2011-10-09 LAB — URINE MICROSCOPIC-ADD ON

## 2011-10-09 NOTE — MAU Provider Note (Signed)
History     CSN: [id number]  Arrival date and time: 10/09/11 2956   First Provider Initiated Contact with Patient 10/09/11 2058      Chief Complaint  Patient presents with  . Dysmenorrhea   HPI 26 y.o. [id number] at [redacted]w[redacted]d c/o cramping. No bleeding. Concerned she is pregnant with twins - first u/s this pregnancy showed 2 IUGS, second u/s 3 weeks ago showed one fetal pole with heartbeat and 1 GS with yolk sac only. Has not started prenatal care yet.    Past Medical History  Diagnosis Date  . Depression     Past Surgical History  Procedure Date  . Dilation and curettage of uterus     Family History  Problem Relation Age of Onset  . Anesthesia problems Neg Hx   . Hypotension Neg Hx   . Malignant hyperthermia Neg Hx   . Pseudochol deficiency Neg Hx     History  Substance Use Topics  . Smoking status: Current Everyday Smoker    Types: Cigars  . Smokeless tobacco: Never Used  . Alcohol Use: No     occ    Allergies: No Known Allergies  Prescriptions prior to admission  Medication Sig Dispense Refill  . ibuprofen (ADVIL,MOTRIN) 200 MG tablet Take 200 mg by mouth every 6 (six) hours as needed. Takes for pain      . Prenatal Vit-Fe Fumarate-FA (PRENATAL MULTIVITAMIN) TABS Take 1 tablet by mouth daily.        Review of Systems  Constitutional: Negative.   Respiratory: Negative.   Cardiovascular: Negative.   Gastrointestinal: Positive for abdominal pain. Negative for nausea, vomiting, diarrhea and constipation.  Genitourinary: Negative for dysuria, urgency, frequency, hematuria and flank pain.       Negative for vaginal bleeding, vaginal discharge  Musculoskeletal: Negative.   Neurological: Negative.   Psychiatric/Behavioral: Negative.    Physical Exam   Blood pressure 112/66, pulse 96, temperature 97.5 F (36.4 C), temperature source Oral, resp. rate 16, height 5\' 3"  (1.6 m), weight 193 lb 6.4 oz (87.726 kg), last menstrual period 07/22/2011.  Physical Exam    Constitutional: She is oriented to person, place, and time. She appears well-developed and well-nourished. No distress.  HENT:  Head: Normocephalic and atraumatic.  Cardiovascular: Normal rate and regular rhythm.   Respiratory: Effort normal. No respiratory distress.  GI: Soft. She exhibits no distension and no mass. There is no tenderness. There is no rebound and no guarding.  Genitourinary: There is no rash or lesion on the right labia. There is no rash or lesion on the left labia. Uterus is not deviated, not enlarged, not fixed and not tender. Cervix exhibits no motion tenderness, no discharge and no friability. Right adnexum displays no mass, no tenderness and no fullness. Left adnexum displays no mass, no tenderness and no fullness. No erythema, tenderness or bleeding around the vagina. Vaginal discharge (white) found.  Neurological: She is alert and oriented to person, place, and time.  Skin: Skin is warm and dry.  Psychiatric: She has a normal mood and affect.   Bedside u/s shows 1 fetus with + FHR, no evidence of twin pregnancy MAU Course  Procedures  Results for orders placed during the hospital encounter of 10/09/11 (from the past 24 hour(s))  URINALYSIS, ROUTINE W REFLEX MICROSCOPIC     Status: Abnormal   Collection Time   10/09/11  8:22 PM      Component Value Range   Color, Urine YELLOW  YELLOW    APPearance  HAZY (*) CLEAR    Specific Gravity, Urine 1.015  1.005 - 1.030    pH 7.0  5.0 - 8.0    Glucose, UA NEGATIVE  NEGATIVE (mg/dL)   Hgb urine dipstick SMALL (*) NEGATIVE    Bilirubin Urine NEGATIVE  NEGATIVE    Ketones, ur NEGATIVE  NEGATIVE (mg/dL)   Protein, ur NEGATIVE  NEGATIVE (mg/dL)   Urobilinogen, UA 0.2  0.0 - 1.0 (mg/dL)   Nitrite NEGATIVE  NEGATIVE    Leukocytes, UA TRACE (*) NEGATIVE   URINE MICROSCOPIC-ADD ON     Status: Abnormal   Collection Time   10/09/11  8:22 PM      Component Value Range   Squamous Epithelial / LPF MANY (*) RARE    WBC, UA 3-6  <3  (WBC/hpf)   Bacteria, UA FEW (*) RARE    Urine-Other MUCOUS PRESENT    WET PREP, GENITAL     Status: Abnormal   Collection Time   10/09/11  8:57 PM      Component Value Range   Yeast Wet Prep HPF POC NONE SEEN  NONE SEEN    Trich, Wet Prep NONE SEEN  NONE SEEN    Clue Cells Wet Prep HPF POC NONE SEEN  NONE SEEN    WBC, Wet Prep HPF POC MANY (*) NONE SEEN     Assessment and Plan  26 y.o. [id number] at [redacted]w[redacted]d Cramping in pregnancy  Rev'd precautions, f/u for prenatal care as soon as possible  Makell Drohan 10/09/2011, 8:59 PM

## 2011-10-09 NOTE — MAU Note (Signed)
Pt G7 P2 at 11.2wks having cramping x 1 wk.  Feeling weak.  Pt concerned that she is pregnant with twins.  U/S 3 wks ago showed 2 yolk sacs.  Has not started prenatal care.

## 2011-10-09 NOTE — Progress Notes (Signed)
SSE per CNM. Wet prep and cultures collected.   

## 2011-10-09 NOTE — MAU Note (Signed)
N. Bascom Levels, CNM at bedside.  Assessment done and poc discussed with pt.  Bedside US done.

## 2011-10-10 NOTE — MAU Provider Note (Signed)
Attestation of Attending Supervision of Advanced Practitioner: Evaluation and management procedures were performed by the PA/NP/CNM/OB Fellow under my supervision/collaboration. Chart reviewed and agree with management and plan.  Aili Casillas V 10/10/2011 6:51 AM

## 2011-11-01 ENCOUNTER — Inpatient Hospital Stay (HOSPITAL_COMMUNITY)
Admission: AD | Admit: 2011-11-01 | Discharge: 2011-11-01 | Disposition: A | Discharge status: Home / Self Care | Payer: Self-pay | Source: Ambulatory Visit | Attending: Family Medicine | Admitting: Family Medicine

## 2011-11-01 ENCOUNTER — Encounter (HOSPITAL_COMMUNITY): Payer: Self-pay | Admitting: *Deleted

## 2011-11-01 DIAGNOSIS — Z3480 Encounter for supervision of other normal pregnancy, unspecified trimester: Secondary | ICD-10-CM

## 2011-11-01 DIAGNOSIS — O2650 Maternal hypotension syndrome, unspecified trimester: Secondary | ICD-10-CM | POA: Insufficient documentation

## 2011-11-01 DIAGNOSIS — N898 Other specified noninflammatory disorders of vagina: Secondary | ICD-10-CM

## 2011-11-01 DIAGNOSIS — N939 Abnormal uterine and vaginal bleeding, unspecified: Secondary | ICD-10-CM

## 2011-11-01 DIAGNOSIS — O26859 Spotting complicating pregnancy, unspecified trimester: Secondary | ICD-10-CM | POA: Insufficient documentation

## 2011-11-01 LAB — WET PREP, GENITAL: Clue Cells Wet Prep HPF POC: NONE SEEN

## 2011-11-01 LAB — URINALYSIS, ROUTINE W REFLEX MICROSCOPIC
Ketones, ur: NEGATIVE mg/dL
Leukocytes, UA: NEGATIVE
Nitrite: NEGATIVE
Specific Gravity, Urine: 1.025 (ref 1.005–1.030)
Urobilinogen, UA: 0.2 mg/dL (ref 0.0–1.0)
pH: 6.5 (ref 5.0–8.0)

## 2011-11-01 LAB — URINE MICROSCOPIC-ADD ON

## 2011-11-01 MED ORDER — PRENATAL MULTIVITAMIN CH: 1.0000 | ORAL_TABLET | Freq: Every day | ORAL | Status: DC

## 2011-11-01 NOTE — MAU Note (Signed)
Pt had syncope episode @ 0900  and fell on abd from standing position. AT 1130 started spotting when wiped after voiding, got scared and wanted to check out baby.  No prenatal care since until 2 weeks ago she planned TAB, no pad on at this time

## 2011-11-01 NOTE — MAU Provider Note (Signed)
History     CSN: [id number]  Arrival date and time: 11/01/11 1752   None     Chief Complaint  Patient presents with  . Loss of Consciousness  . Vaginal Bleeding   HPI Comments: Pt presents after syncopal episode at 0900 and spotting that occurred 3 hours later.  She reports syncopal episode that occurred following voiding and that she fell and struck the right side of her abdomen.  She had no cramping/contractions, no significant bleeding, no LOC with this episode.  She reports having episodes similar to this with her previous term pregnancies and was monitored q 2 weeks in [facility].   She has received no pre-natal care with this pregnancy but plans to establish with [facility]. Previously this pregnancy [place] in MAU revealed twin pregnancy with only a yolk sac present for twin B.  Repeat US 10 days later revealed only 1 viable fetus. Pt also reports she was planning on aborting this pregnancy up until 2 weeks ago; has changed her mind and FOB is aware and encouraging pt to receive care.    OB History    Grav Para Term Preterm Abortions TAB SAB Ect Mult Living   7 2 2  4 4    2       Past Medical History  Diagnosis Date  . Depression     Past Surgical History  Procedure Date  . Dilation and curettage of uterus     Family History  Problem Relation Age of Onset  . Anesthesia problems Neg Hx   . Hypotension Neg Hx   . Malignant hyperthermia Neg Hx   . Pseudochol deficiency Neg Hx   . Diabetes Mother   . Hypertension Mother     History  Substance Use Topics  . Smoking status: Former Smoker    Types: Cigars  . Smokeless tobacco: Never Used  . Alcohol Use: No     occ    Allergies: No Known Allergies  Prescriptions prior to admission  Medication Sig Dispense Refill  . ibuprofen (ADVIL,MOTRIN) 200 MG tablet Take 200 mg by mouth every 6 (six) hours as needed. Takes for pain      . Prenatal Vit-Fe Fumarate-FA (PRENATAL MULTIVITAMIN) TABS Take 1 tablet by  mouth daily.        Review of Systems  Constitutional: Negative for fever, chills and weight loss.  HENT: Negative for congestion and sore throat.        HA since fall/syncopal episode this AM  Eyes: Negative for blurred vision, double vision and photophobia.  Respiratory: Negative for cough, hemoptysis, sputum production, shortness of breath, wheezing and stridor.   Cardiovascular: Negative for chest pain and leg swelling.  Gastrointestinal: Positive for heartburn, nausea and abdominal pain. Negative for vomiting, diarrhea, constipation and blood in stool.  Genitourinary: Negative for dysuria, urgency, frequency and hematuria.       Occasional intermittent discharge that pt reports using unknown antifungal cream for  Musculoskeletal: Positive for falls.  Neurological: Positive for dizziness and headaches. Negative for sensory change and focal weakness.   Physical Exam   Blood pressure 111/68, pulse 99, temperature 98.4 F (36.9 C), temperature source Oral, resp. rate 18, height 5\' 4"  (1.626 m), weight 86.637 kg (191 lb), last menstrual period 07/22/2011.  Physical Exam  Constitutional: She appears well-developed. She appears distressed.       Emotionally distressed.   HENT:  Head: Normocephalic and atraumatic.  Eyes: Pupils are equal, round, and reactive to light. Right  eye exhibits no discharge. Left eye exhibits no discharge. No scleral icterus.  Respiratory: Effort normal. No respiratory distress.  GI: Soft. She exhibits no distension and no mass. There is tenderness. There is no rebound and no guarding.  Genitourinary: Rectum normal and vagina normal. Pelvic exam was performed with patient supine. Cervix exhibits discharge.  Musculoskeletal: Normal range of motion. She exhibits no edema and no tenderness.  Skin: Skin is warm and dry. She is not diaphoretic.  Psychiatric: Her behavior is normal. Judgment and thought content normal.       Pt distressed with pregnancy; mood is  labile   Results for orders placed during the hospital encounter of 11/01/11 (from the past 24 hour(s))  WET PREP, GENITAL     Status: Abnormal   Collection Time   11/01/11  7:52 PM      Component Value Range   Yeast Wet Prep HPF POC NONE SEEN  NONE SEEN    Trich, Wet Prep NONE SEEN  NONE SEEN    Clue Cells Wet Prep HPF POC NONE SEEN  NONE SEEN    WBC, Wet Prep HPF POC MODERATE (*) NONE SEEN   URINALYSIS, ROUTINE W REFLEX MICROSCOPIC     Status: Abnormal   Collection Time   11/01/11  7:57 PM      Component Value Range   Color, Urine YELLOW  YELLOW    APPearance CLOUDY (*) CLEAR    Specific Gravity, Urine 1.025  1.005 - 1.030    pH 6.5  5.0 - 8.0    Glucose, UA NEGATIVE  NEGATIVE (mg/dL)   Hgb urine dipstick TRACE (*) NEGATIVE    Bilirubin Urine NEGATIVE  NEGATIVE    Ketones, ur NEGATIVE  NEGATIVE (mg/dL)   Protein, ur NEGATIVE  NEGATIVE (mg/dL)   Urobilinogen, UA 0.2  0.0 - 1.0 (mg/dL)   Nitrite NEGATIVE  NEGATIVE    Leukocytes, UA NEGATIVE  NEGATIVE   URINE MICROSCOPIC-ADD ON     Status: Abnormal   Collection Time   11/01/11  7:57 PM      Component Value Range   Squamous Epithelial / LPF FEW (*) RARE    WBC, UA 0-2  <3 (WBC/hpf)   Bacteria, UA MANY (*) RARE    Urine-Other MUCOUS PRESENT      MAU Course  Procedures  MDM B+ 26yo [id number] at 14-4 presenting with spotting following a syncopal episode consistent with syncopal episodes during previous pregnancies Will check UA for dehydration and ?UTI.  Check Cultures and Wet Prep as cervical infections can lead to bleeding. Defer [place] at this time as no blood visible from cervix and maternal VS stable 12+ hours post fall and only scant blood reported.  Assessment and Plan    RIGBY, MICHAEL 11/01/2011, 7:17 PM   Patient seen and examined with Dr Berline Chough.  Agree with above note.    Assessment/Plan: #1 Vaginal bleeding Likely normal spotting in pregnancy.  Warning signs given to patient.  GC/Chlamydia still pending.  Pt  referred to [facility].  Levie Heritage, DO 11/01/2011 8:25 PM

## 2011-11-01 NOTE — Discharge Instructions (Signed)
Vaginal Bleeding During Pregnancy, First Trimester  A small amount of bleeding (spotting) is relatively common in early pregnancy. It usually stops on its own. There are many causes for bleeding or spotting in early pregnancy. Some bleeding may be related to the pregnancy and some may not. Cramping with the bleeding is more serious and concerning. Tell your caregiver if you have any vaginal bleeding.   CAUSES    It is normal in most cases.   The pregnancy ends (miscarriage).   The pregnancy may end (threatened miscarriage).   Infection or inflammation of the cervix.   Growths (polyps) on the cervix.   Pregnancy happens outside of the uterus and in a fallopian tube (tubal pregnancy).   Many tiny cysts in the uterus instead of pregnancy tissue (molar pregnancy).  SYMPTOMS   Vaginal bleeding or spotting with or without cramps.  DIAGNOSIS   To evaluate the pregnancy, your caregiver may:   Do a pelvic exam.   Take blood tests.   Do an ultrasound.  It is very important to follow your caregiver's instructions.   TREATMENT    Evaluation of the pregnancy with blood tests and ultrasound.   Bed rest (getting up to use the bathroom only).   Rho-gam immunization if the mother is Rh negative and the father is Rh positive.  HOME CARE INSTRUCTIONS    If your caregiver orders bed rest, you may need to make arrangements for the care of other children and for other responsibilities. However, your caregiver may allow you to continue light activity.   Keep track of the number of pads you use each day, how often you change pads and how soaked (saturated) they are. Write this down.   Do not use tampons. Do not douche.   Do not have sexual intercourse or orgasms until approved by your physician.   Save any tissue that you pass for your caregiver to see.   Take medicine for cramps only with your caregiver's permission.   Do not take aspirin because it can make you bleed.  SEEK IMMEDIATE MEDICAL CARE IF:    You  experience severe cramps in your stomach, back or belly (abdomen).   You have an oral temperature above 102 F (38.9 C), not controlled by medicine.   You pass large clots or tissue.   Your bleeding increases or you become light-headed, weak or have fainting episodes.   You develop chills.   You are leaking or have a gush of fluid from your vagina.   You pass out while having a bowel movement. That may mean you have a ruptured tubal pregnancy.  Document Released: 03/13/2005 Document Revised: 05/23/2011 Document Reviewed: 09/22/2008  [employer] Patient Information 2012 [employer]

## 2011-11-13 ENCOUNTER — Encounter: Payer: Self-pay | Admitting: Family Medicine

## 2011-11-13 ENCOUNTER — Ambulatory Visit (INDEPENDENT_AMBULATORY_CARE_PROVIDER_SITE_OTHER): Payer: Self-pay | Admitting: Family Medicine

## 2011-11-13 VITALS — BP 112/73 | Temp 98.2°F | Wt 197.3 lb

## 2011-11-13 DIAGNOSIS — Z331 Pregnant state, incidental: Secondary | ICD-10-CM

## 2011-11-13 DIAGNOSIS — Z3480 Encounter for supervision of other normal pregnancy, unspecified trimester: Secondary | ICD-10-CM

## 2011-11-13 LAB — POCT URINALYSIS DIP (DEVICE)
Bilirubin Urine: NEGATIVE
Ketones, ur: NEGATIVE mg/dL
Protein, ur: NEGATIVE mg/dL
pH: 6.5 (ref 5.0–8.0)

## 2011-11-13 NOTE — Progress Notes (Signed)
Pulse 89. Intermittent pain on pelvic and side of abdomen. No pressure.

## 2011-11-13 NOTE — Progress Notes (Signed)
   Subjective:    Lauren Hayden is a [id number] [redacted]w[redacted]d being seen today for her first obstetrical visit.  Her obstetrical history is significant for normal vaginal deliveries. Patient does intend to breast feed. Pregnancy history fully reviewed.  Patient reports no bleeding, no contractions, no cramping and no leaking.  Filed Vitals:   11/13/11 0853  BP: 112/73  Temp: 98.2 F (36.8 C)  Weight: 197 lb 4.8 oz (89.495 kg)    HISTORY: OB History    Grav Para Term Preterm Abortions TAB SAB Ect Mult Living   7 2 2  4 4    2      # Outc Date GA Lbr Len/2nd Wgt Sex Del Anes PTL Lv   1 TAB 2005           2 TAB 2006           3 TRM 2007 [redacted]w[redacted]d  6lb13oz(3.09kg) F SVD EPI     4 TRM 2008 [redacted]w[redacted]d  6lb10oz(3.005kg) F SVD EPI     5 TAB            6 TAB            7 CUR              Past Medical History  Diagnosis Date  . Depression    Past Surgical History  Procedure Date  . Dilation and curettage of uterus    Family History  Problem Relation Age of Onset  . Anesthesia problems Neg Hx   . Hypotension Neg Hx   . Malignant hyperthermia Neg Hx   . Pseudochol deficiency Neg Hx   . Diabetes Mother   . Hypertension Mother   . Hypertension Father   . Asthma Sister      Exam    Uterus:     Pelvic Exam:    Perineum: No Hemorrhoids   Vulva: normal   Vagina:  normal mucosa       Cervix: no bleeding following Pap, no cervical motion tenderness and no lesions   Adnexa: normal adnexa   Bony Pelvis: average  System:     Skin: normal coloration and turgor, no rashes    Neurologic: oriented, normal, grossly non-focal   Extremities: normal strength, tone, and muscle mass   HEENT extra ocular movement intact   Mouth/Teeth mucous membranes moist, pharynx normal without lesions   Neck supple and no masses   Cardiovascular: regular rate and rhythm, no murmurs or gallops   Respiratory:  appears well, vitals normal, no respiratory distress, acyanotic, normal RR, ear and throat exam is normal, neck  free of mass or lymphadenopathy, chest clear, no wheezing, crepitations, rhonchi, normal symmetric air entry   Abdomen: soft, non-tender; bowel sounds normal; no masses,  no organomegaly   Urinary: urethral meatus normal      Assessment:    Pregnancy: [id number] Patient Active Problem List  Diagnoses  . History of chlamydia infection  . Supervision of other normal pregnancy        Plan:     Initial labs drawn. Prenatal vitamins. Problem list reviewed and updated. Genetic Screening discussed Quad Screen: ordered.  Ultrasound discussed; fetal survey: ordered.  Follow up in 4 weeks. 50% of 45 min visit spent on counseling and coordination of care.     Candelaria Celeste JEHIEL 11/13/2011

## 2011-11-13 NOTE — Patient Instructions (Addendum)
Pregnancy - Second Trimester The second trimester of pregnancy (3 to 6 months) is a period of rapid growth for you and your baby. At the end of the sixth month, your baby is about 9 inches long and weighs 1 1/2 pounds. You will begin to feel the baby move between 18 and 20 weeks of the pregnancy. This is called quickening. Weight gain is faster. A clear fluid (colostrum) may leak out of your breasts. You may feel small contractions of the womb (uterus). This is known as false labor or Braxton-Hicks contractions. This is like a practice for labor when the baby is ready to be born. Usually, the problems with morning sickness have usually passed by the end of your first trimester. Some women develop small dark blotches (called cholasma, mask of pregnancy) on their face that usually goes away after the baby is born. Exposure to the sun makes the blotches worse. Acne may also develop in some pregnant women and pregnant women who have acne, may find that it goes away. PRENATAL EXAMS  Blood work may continue to be done during prenatal exams. These tests are done to check on your health and the probable health of your baby. Blood work is used to follow your blood levels (hemoglobin). Anemia (low hemoglobin) is common during pregnancy. Iron and vitamins are given to help prevent this. You will also be checked for diabetes between 24 and 28 weeks of the pregnancy. Some of the previous blood tests may be repeated.   The size of the uterus is measured during each visit. This is to make sure that the baby is continuing to grow properly according to the dates of the pregnancy.   Your blood pressure is checked every prenatal visit. This is to make sure you are not getting toxemia.   Your urine is checked to make sure you do not have an infection, diabetes or protein in the urine.   Your weight is checked often to make sure gains are happening at the suggested rate. This is to ensure that both you and your baby are  growing normally.   Sometimes, an ultrasound is performed to confirm the proper growth and development of the baby. This is a test which bounces harmless sound waves off the baby so your caregiver can more accurately determine due dates.  Sometimes, a specialized test is done on the amniotic fluid surrounding the baby. This test is called an amniocentesis. The amniotic fluid is obtained by sticking a needle into the belly (abdomen). This is done to check the chromosomes in instances where there is a concern about possible genetic problems with the baby. It is also sometimes done near the end of pregnancy if an early delivery is required. In this case, it is done to help make sure the baby's lungs are mature enough for the baby to live outside of the womb. CHANGES OCCURING IN THE SECOND TRIMESTER OF PREGNANCY Your body goes through many changes during pregnancy. They vary from person to person. Talk to your caregiver about changes you notice that you are concerned about.  During the second trimester, you will likely have an increase in your appetite. It is normal to have cravings for certain foods. This varies from person to person and pregnancy to pregnancy.   Your lower abdomen will begin to bulge.   You may have to urinate more often because the uterus and baby are pressing on your bladder. It is also common to get more bladder infections during pregnancy (  pain with urination). You can help this by drinking lots of fluids and emptying your bladder before and after intercourse.   You may begin to get stretch marks on your hips, abdomen, and breasts. These are normal changes in the body during pregnancy. There are no exercises or medications to take that prevent this change.   You may begin to develop swollen and bulging veins (varicose veins) in your legs. Wearing support hose, elevating your feet for 15 minutes, 3 to 4 times a day and limiting salt in your diet helps lessen the problem.    Heartburn may develop as the uterus grows and pushes up against the stomach. Antacids recommended by your caregiver helps with this problem. Also, eating smaller meals 4 to 5 times a day helps.   Constipation can be treated with a stool softener or adding bulk to your diet. Drinking lots of fluids, vegetables, fruits, and whole grains are helpful.   Exercising is also helpful. If you have been very active up until your pregnancy, most of these activities can be continued during your pregnancy. If you have been less active, it is helpful to start an exercise program such as walking.   Hemorrhoids (varicose veins in the rectum) may develop at the end of the second trimester. Warm sitz baths and hemorrhoid cream recommended by your caregiver helps hemorrhoid problems.   Backaches may develop during this time of your pregnancy. Avoid heavy lifting, wear low heal shoes and practice good posture to help with backache problems.   Some pregnant women develop tingling and numbness of their hand and fingers because of swelling and tightening of ligaments in the wrist (carpel tunnel syndrome). This goes away after the baby is born.   As your breasts enlarge, you may have to get a bigger bra. Get a comfortable, cotton, support bra. Do not get a nursing bra until the last month of the pregnancy if you will be nursing the baby.   You may get a dark line from your belly button to the pubic area called the linea nigra.   You may develop rosy cheeks because of increase blood flow to the face.   You may develop spider looking lines of the face, neck, arms and chest. These go away after the baby is born.  HOME CARE INSTRUCTIONS   It is extremely important to avoid all smoking, herbs, alcohol, and unprescribed drugs during your pregnancy. These chemicals affect the formation and growth of the baby. Avoid these chemicals throughout the pregnancy to ensure the delivery of a healthy infant.   Most of your home  care instructions are the same as suggested for the first trimester of your pregnancy. Keep your caregiver's appointments. Follow your caregiver's instructions regarding medication use, exercise and diet.   During pregnancy, you are providing food for you and your baby. Continue to eat regular, well-balanced meals. Choose foods such as meat, fish, milk and other low fat dairy products, vegetables, fruits, and whole-grain breads and cereals. Your caregiver will tell you of the ideal weight gain.   A physical sexual relationship may be continued up until near the end of pregnancy if there are no other problems. Problems could include early (premature) leaking of amniotic fluid from the membranes, vaginal bleeding, abdominal pain, or other medical or pregnancy problems.   Exercise regularly if there are no restrictions. Check with your caregiver if you are unsure of the safety of some of your exercises. The greatest weight gain will occur in the   last 2 trimesters of pregnancy. Exercise will help you:   Control your weight.   Get you in shape for labor and delivery.   Lose weight after you have the baby.   Wear a good support or jogging bra for breast tenderness during pregnancy. This may help if worn during sleep. Pads or tissues may be used in the bra if you are leaking colostrum.   Do not use hot tubs, steam rooms or saunas throughout the pregnancy.   Wear your seat belt at all times when driving. This protects you and your baby if you are in an accident.   Avoid raw meat, uncooked cheese, cat litter boxes and soil used by cats. These carry germs that can cause birth defects in the baby.   The second trimester is also a good time to visit your dentist for your dental health if this has not been done yet. Getting your teeth cleaned is OK. Use a soft toothbrush. Brush gently during pregnancy.   It is easier to loose urine during pregnancy. Tightening up and strengthening the pelvic muscles will  help with this problem. Practice stopping your urination while you are going to the bathroom. These are the same muscles you need to strengthen. It is also the muscles you would use as if you were trying to stop from passing gas. You can practice tightening these muscles up 10 times a set and repeating this about 3 times per day. Once you know what muscles to tighten up, do not perform these exercises during urination. It is more likely to contribute to an infection by backing up the urine.   Ask for help if you have financial, counseling or nutritional needs during pregnancy. Your caregiver will be able to offer counseling for these needs as well as refer you for other special needs.   Your skin may become oily. If so, wash your face with mild soap, use non-greasy moisturizer and oil or cream based makeup.  MEDICATIONS AND DRUG USE IN PREGNANCY  Take prenatal vitamins as directed. The vitamin should contain 1 milligram of folic acid. Keep all vitamins out of reach of children. Only a couple vitamins or tablets containing iron may be fatal to a baby or young child when ingested.   Avoid use of all medications, including herbs, over-the-counter medications, not prescribed or suggested by your caregiver. Only take over-the-counter or prescription medicines for pain, discomfort, or fever as directed by your caregiver. Do not use aspirin.   Let your caregiver also know about herbs you may be using.   Alcohol is related to a number of birth defects. This includes fetal alcohol syndrome. All alcohol, in any form, should be avoided completely. Smoking will cause low birth rate and premature babies.   Street or illegal drugs are very harmful to the baby. They are absolutely forbidden. A baby born to an addicted mother will be addicted at birth. The baby will go through the same withdrawal an adult does.  SEEK MEDICAL CARE IF:  You have any concerns or worries during your pregnancy. It is better to call with  your questions if you feel they cannot wait, rather than worry about them. SEEK IMMEDIATE MEDICAL CARE IF:   An unexplained oral temperature above 102 F (38.9 C) develops, or as your caregiver suggests.   You have leaking of fluid from the vagina (birth canal). If leaking membranes are suspected, take your temperature and tell your caregiver of this when you call.   There   is vaginal spotting, bleeding, or passing clots. Tell your caregiver of the amount and how many pads are used. Light spotting in pregnancy is common, especially following intercourse.   You develop a bad smelling vaginal discharge with a change in the color from clear to white.   You continue to feel sick to your stomach (nauseated) and have no relief from remedies suggested. You vomit blood or coffee ground-like materials.   You lose more than 2 pounds of weight or gain more than 2 pounds of weight over 1 week, or as suggested by your caregiver.   You notice swelling of your face, hands, feet, or legs.   You get exposed to German measles and have never had them.   You are exposed to fifth disease or chickenpox.   You develop belly (abdominal) pain. Round ligament discomfort is a common non-cancerous (benign) cause of abdominal pain in pregnancy. Your caregiver still must evaluate you.   You develop a bad headache that does not go away.   You develop fever, diarrhea, pain with urination, or shortness of breath.   You develop visual problems, blurry, or double vision.   You fall or are in a car accident or any kind of trauma.   There is mental or physical violence at home.  Document Released: 05/28/2001 Document Revised: 05/23/2011 Document Reviewed: 11/30/2008 [employer] Patient Information 2012 [employer]. Pregnancy - Second Trimester The second trimester of pregnancy (3 to 6 months) is a period of rapid growth for you and your baby. At the end of the sixth month, your baby is about 9 inches long and weighs  1 1/2 pounds. You will begin to feel the baby move between 18 and 20 weeks of the pregnancy. This is called quickening. Weight gain is faster. A clear fluid (colostrum) may leak out of your breasts. You may feel small contractions of the womb (uterus). This is known as false labor or Braxton-Hicks contractions. This is like a practice for labor when the baby is ready to be born. Usually, the problems with morning sickness have usually passed by the end of your first trimester. Some women develop small dark blotches (called cholasma, mask of pregnancy) on their face that usually goes away after the baby is born. Exposure to the sun makes the blotches worse. Acne may also develop in some pregnant women and pregnant women who have acne, may find that it goes away. PRENATAL EXAMS  Blood work may continue to be done during prenatal exams. These tests are done to check on your health and the probable health of your baby. Blood work is used to follow your blood levels (hemoglobin). Anemia (low hemoglobin) is common during pregnancy. Iron and vitamins are given to help prevent this. You will also be checked for diabetes between 24 and 28 weeks of the pregnancy. Some of the previous blood tests may be repeated.   The size of the uterus is measured during each visit. This is to make sure that the baby is continuing to grow properly according to the dates of the pregnancy.   Your blood pressure is checked every prenatal visit. This is to make sure you are not getting toxemia.   Your urine is checked to make sure you do not have an infection, diabetes or protein in the urine.   Your weight is checked often to make sure gains are happening at the suggested rate. This is to ensure that both you and your baby are growing normally.   Sometimes,   an ultrasound is performed to confirm the proper growth and development of the baby. This is a test which bounces harmless sound waves off the baby so your caregiver can more  accurately determine due dates.  Sometimes, a specialized test is done on the amniotic fluid surrounding the baby. This test is called an amniocentesis. The amniotic fluid is obtained by sticking a needle into the belly (abdomen). This is done to check the chromosomes in instances where there is a concern about possible genetic problems with the baby. It is also sometimes done near the end of pregnancy if an early delivery is required. In this case, it is done to help make sure the baby's lungs are mature enough for the baby to live outside of the womb. CHANGES OCCURING IN THE SECOND TRIMESTER OF PREGNANCY Your body goes through many changes during pregnancy. They vary from person to person. Talk to your caregiver about changes you notice that you are concerned about.  During the second trimester, you will likely have an increase in your appetite. It is normal to have cravings for certain foods. This varies from person to person and pregnancy to pregnancy.   Your lower abdomen will begin to bulge.   You may have to urinate more often because the uterus and baby are pressing on your bladder. It is also common to get more bladder infections during pregnancy (pain with urination). You can help this by drinking lots of fluids and emptying your bladder before and after intercourse.   You may begin to get stretch marks on your hips, abdomen, and breasts. These are normal changes in the body during pregnancy. There are no exercises or medications to take that prevent this change.   You may begin to develop swollen and bulging veins (varicose veins) in your legs. Wearing support hose, elevating your feet for 15 minutes, 3 to 4 times a day and limiting salt in your diet helps lessen the problem.   Heartburn may develop as the uterus grows and pushes up against the stomach. Antacids recommended by your caregiver helps with this problem. Also, eating smaller meals 4 to 5 times a day helps.   Constipation can  be treated with a stool softener or adding bulk to your diet. Drinking lots of fluids, vegetables, fruits, and whole grains are helpful.   Exercising is also helpful. If you have been very active up until your pregnancy, most of these activities can be continued during your pregnancy. If you have been less active, it is helpful to start an exercise program such as walking.   Hemorrhoids (varicose veins in the rectum) may develop at the end of the second trimester. Warm sitz baths and hemorrhoid cream recommended by your caregiver helps hemorrhoid problems.   Backaches may develop during this time of your pregnancy. Avoid heavy lifting, wear low heal shoes and practice good posture to help with backache problems.   Some pregnant women develop tingling and numbness of their hand and fingers because of swelling and tightening of ligaments in the wrist (carpel tunnel syndrome). This goes away after the baby is born.   As your breasts enlarge, you may have to get a bigger bra. Get a comfortable, cotton, support bra. Do not get a nursing bra until the last month of the pregnancy if you will be nursing the baby.   You may get a dark line from your belly button to the pubic area called the linea nigra.   You may develop rosy cheeks   because of increase blood flow to the face.   You may develop spider looking lines of the face, neck, arms and chest. These go away after the baby is born.  HOME CARE INSTRUCTIONS   It is extremely important to avoid all smoking, herbs, alcohol, and unprescribed drugs during your pregnancy. These chemicals affect the formation and growth of the baby. Avoid these chemicals throughout the pregnancy to ensure the delivery of a healthy infant.   Most of your home care instructions are the same as suggested for the first trimester of your pregnancy. Keep your caregiver's appointments. Follow your caregiver's instructions regarding medication use, exercise and diet.   During  pregnancy, you are providing food for you and your baby. Continue to eat regular, well-balanced meals. Choose foods such as meat, fish, milk and other low fat dairy products, vegetables, fruits, and whole-grain breads and cereals. Your caregiver will tell you of the ideal weight gain.   A physical sexual relationship may be continued up until near the end of pregnancy if there are no other problems. Problems could include early (premature) leaking of amniotic fluid from the membranes, vaginal bleeding, abdominal pain, or other medical or pregnancy problems.   Exercise regularly if there are no restrictions. Check with your caregiver if you are unsure of the safety of some of your exercises. The greatest weight gain will occur in the last 2 trimesters of pregnancy. Exercise will help you:   Control your weight.   Get you in shape for labor and delivery.   Lose weight after you have the baby.   Wear a good support or jogging bra for breast tenderness during pregnancy. This may help if worn during sleep. Pads or tissues may be used in the bra if you are leaking colostrum.   Do not use hot tubs, steam rooms or saunas throughout the pregnancy.   Wear your seat belt at all times when driving. This protects you and your baby if you are in an accident.   Avoid raw meat, uncooked cheese, cat litter boxes and soil used by cats. These carry germs that can cause birth defects in the baby.   The second trimester is also a good time to visit your dentist for your dental health if this has not been done yet. Getting your teeth cleaned is OK. Use a soft toothbrush. Brush gently during pregnancy.   It is easier to loose urine during pregnancy. Tightening up and strengthening the pelvic muscles will help with this problem. Practice stopping your urination while you are going to the bathroom. These are the same muscles you need to strengthen. It is also the muscles you would use as if you were trying to stop from  passing gas. You can practice tightening these muscles up 10 times a set and repeating this about 3 times per day. Once you know what muscles to tighten up, do not perform these exercises during urination. It is more likely to contribute to an infection by backing up the urine.   Ask for help if you have financial, counseling or nutritional needs during pregnancy. Your caregiver will be able to offer counseling for these needs as well as refer you for other special needs.   Your skin may become oily. If so, wash your face with mild soap, use non-greasy moisturizer and oil or cream based makeup.  MEDICATIONS AND DRUG USE IN PREGNANCY  Take prenatal vitamins as directed. The vitamin should contain 1 milligram of folic acid. Keep all   vitamins out of reach of children. Only a couple vitamins or tablets containing iron may be fatal to a baby or young child when ingested.   Avoid use of all medications, including herbs, over-the-counter medications, not prescribed or suggested by your caregiver. Only take over-the-counter or prescription medicines for pain, discomfort, or fever as directed by your caregiver. Do not use aspirin.   Let your caregiver also know about herbs you may be using.   Alcohol is related to a number of birth defects. This includes fetal alcohol syndrome. All alcohol, in any form, should be avoided completely. Smoking will cause low birth rate and premature babies.   Street or illegal drugs are very harmful to the baby. They are absolutely forbidden. A baby born to an addicted mother will be addicted at birth. The baby will go through the same withdrawal an adult does.  SEEK MEDICAL CARE IF:  You have any concerns or worries during your pregnancy. It is better to call with your questions if you feel they cannot wait, rather than worry about them. SEEK IMMEDIATE MEDICAL CARE IF:   An unexplained oral temperature above 102 F (38.9 C) develops, or as your caregiver suggests.   You  have leaking of fluid from the vagina (birth canal). If leaking membranes are suspected, take your temperature and tell your caregiver of this when you call.   There is vaginal spotting, bleeding, or passing clots. Tell your caregiver of the amount and how many pads are used. Light spotting in pregnancy is common, especially following intercourse.   You develop a bad smelling vaginal discharge with a change in the color from clear to white.   You continue to feel sick to your stomach (nauseated) and have no relief from remedies suggested. You vomit blood or coffee ground-like materials.   You lose more than 2 pounds of weight or gain more than 2 pounds of weight over 1 week, or as suggested by your caregiver.   You notice swelling of your face, hands, feet, or legs.   You get exposed to German measles and have never had them.   You are exposed to fifth disease or chickenpox.   You develop belly (abdominal) pain. Round ligament discomfort is a common non-cancerous (benign) cause of abdominal pain in pregnancy. Your caregiver still must evaluate you.   You develop a bad headache that does not go away.   You develop fever, diarrhea, pain with urination, or shortness of breath.   You develop visual problems, blurry, or double vision.   You fall or are in a car accident or any kind of trauma.   There is mental or physical violence at home.  Document Released: 05/28/2001 Document Revised: 05/23/2011 Document Reviewed: 11/30/2008 [employer] Patient Information 2012 [employer]. 

## 2011-11-14 LAB — OBSTETRIC PANEL
Basophils Absolute: 0 10*3/uL (ref 0.0–0.1)
Basophils Relative: 0 % (ref 0–1)
Eosinophils Absolute: 0.1 10*3/uL (ref 0.0–0.7)
Hemoglobin: 11.2 g/dL — ABNORMAL LOW (ref 12.0–15.0)
Hepatitis B Surface Ag: NEGATIVE
MCH: 28.1 pg (ref 26.0–34.0)
MCHC: 33.3 g/dL (ref 30.0–36.0)
Neutro Abs: 10.6 10*3/uL — ABNORMAL HIGH (ref 1.7–7.7)
Neutrophils Relative %: 77 % (ref 43–77)
Platelets: 291 10*3/uL (ref 150–400)
RDW: 15.6 % — ABNORMAL HIGH (ref 11.5–15.5)
Rh Type: POSITIVE

## 2011-11-14 LAB — WET PREP, GENITAL: Trich, Wet Prep: NONE SEEN

## 2011-11-14 LAB — HIV ANTIBODY (ROUTINE TESTING W REFLEX): HIV: NONREACTIVE

## 2011-11-15 LAB — HEMOGLOBINOPATHY EVALUATION
Hemoglobin Other: 0 %
Hgb A2 Quant: 2.5 % (ref 2.2–3.2)
Hgb A: 97.5 % (ref 96.8–97.8)
Hgb S Quant: 0 %

## 2011-11-18 ENCOUNTER — Telehealth: Payer: Self-pay | Admitting: *Deleted

## 2011-11-18 ENCOUNTER — Other Ambulatory Visit: Payer: Self-pay | Admitting: Family Medicine

## 2011-11-18 DIAGNOSIS — Z8619 Personal history of other infectious and parasitic diseases: Secondary | ICD-10-CM

## 2011-11-18 DIAGNOSIS — Z3480 Encounter for supervision of other normal pregnancy, unspecified trimester: Secondary | ICD-10-CM

## 2011-11-18 MED ORDER — METRONIDAZOLE 500 MG PO TABS: 500.0000 mg | ORAL_TABLET | Freq: Two times a day (BID) | ORAL | Status: AC

## 2011-11-18 MED ORDER — FLUCONAZOLE 150 MG PO TABS: 150.0000 mg | ORAL_TABLET | Freq: Once | ORAL | Status: AC

## 2011-11-18 NOTE — Telephone Encounter (Signed)
Message copied by Mannie Stabile on Mon Nov 18, 2011 11:26 AM ------      Message from: Levie Heritage      Created: Mon Nov 18, 2011 12:44 AM       +BV and yeast infection.  Flagyl 500mg  bid x 7 days and diflucan 150mg  x 1 dose sent to pharmacy.

## 2011-11-18 NOTE — Telephone Encounter (Signed)
Pt notified of results will go pick up her medicine.

## 2011-11-21 ENCOUNTER — Encounter: Payer: Self-pay | Admitting: *Deleted

## 2011-11-27 ENCOUNTER — Ambulatory Visit (HOSPITAL_COMMUNITY)
Admission: RE | Admit: 2011-11-27 | Discharge: 2011-11-27 | Disposition: A | Discharge status: Home / Self Care | Payer: Self-pay | Source: Ambulatory Visit | Attending: Family Medicine | Admitting: Family Medicine

## 2011-11-27 ENCOUNTER — Ambulatory Visit (HOSPITAL_COMMUNITY): Payer: Self-pay

## 2011-11-27 DIAGNOSIS — Z1389 Encounter for screening for other disorder: Secondary | ICD-10-CM | POA: Insufficient documentation

## 2011-11-27 DIAGNOSIS — O358XX0 Maternal care for other (suspected) fetal abnormality and damage, not applicable or unspecified: Secondary | ICD-10-CM | POA: Insufficient documentation

## 2011-11-27 DIAGNOSIS — Z3480 Encounter for supervision of other normal pregnancy, unspecified trimester: Secondary | ICD-10-CM

## 2011-11-27 DIAGNOSIS — Z363 Encounter for antenatal screening for malformations: Secondary | ICD-10-CM | POA: Insufficient documentation

## 2011-12-02 ENCOUNTER — Encounter: Payer: Self-pay | Admitting: Family Medicine

## 2011-12-11 ENCOUNTER — Ambulatory Visit (INDEPENDENT_AMBULATORY_CARE_PROVIDER_SITE_OTHER): Payer: Self-pay | Admitting: Advanced Practice Midwife

## 2011-12-11 VITALS — BP 111/72 | Temp 98.7°F | Wt 201.7 lb

## 2011-12-11 DIAGNOSIS — Z3480 Encounter for supervision of other normal pregnancy, unspecified trimester: Secondary | ICD-10-CM

## 2011-12-11 DIAGNOSIS — Z3400 Encounter for supervision of normal first pregnancy, unspecified trimester: Secondary | ICD-10-CM

## 2011-12-11 DIAGNOSIS — Z202 Contact with and (suspected) exposure to infections with a predominantly sexual mode of transmission: Secondary | ICD-10-CM

## 2011-12-11 DIAGNOSIS — Z2089 Contact with and (suspected) exposure to other communicable diseases: Secondary | ICD-10-CM

## 2011-12-11 DIAGNOSIS — O98219 Gonorrhea complicating pregnancy, unspecified trimester: Secondary | ICD-10-CM

## 2011-12-11 DIAGNOSIS — A5400 Gonococcal infection of lower genitourinary tract, unspecified: Secondary | ICD-10-CM

## 2011-12-11 LAB — POCT URINALYSIS DIP (DEVICE)
Bilirubin Urine: NEGATIVE
Glucose, UA: NEGATIVE mg/dL
Nitrite: NEGATIVE
Urobilinogen, UA: 0.2 mg/dL (ref 0.0–1.0)

## 2011-12-11 MED ORDER — AZITHROMYCIN 500 MG PO TABS: 1000.0000 mg | ORAL_TABLET | Freq: Once | ORAL | Status: DC

## 2011-12-11 MED ORDER — CEFTRIAXONE SODIUM 250 MG IJ SOLR: 250.0000 mg | Freq: Once | INTRAMUSCULAR | Status: DC

## 2011-12-11 MED ORDER — AZITHROMYCIN 250 MG PO TABS
1000.0000 mg | ORAL_TABLET | Freq: Once | ORAL | Status: AC
  Administered 2011-12-11: 1000 mg via ORAL

## 2011-12-11 MED ORDER — CEFTRIAXONE SODIUM 1 G IJ SOLR
250.0000 mg | Freq: Once | INTRAMUSCULAR | Status: AC
  Administered 2011-12-11: 250 mg via INTRAMUSCULAR

## 2011-12-11 NOTE — Patient Instructions (Signed)
Sexually Transmitted Disease Sexually transmitted disease (STD) refers to any infection that is passed from person to person during sexual activity. This may happen by way of saliva, semen, blood, vaginal mucus, or urine. Common STDs include:  Gonorrhea.   Chlamydia.   Syphilis.   HIV/AIDS.   Genital herpes.   Hepatitis B and C.   Trichomonas.   Human papillomavirus (HPV).   Pubic lice.  CAUSES  An STD may be spread by bacteria, virus, or parasite. A person can get an STD by:  Sexual intercourse with an infected person.   Sharing sex toys with an infected person.   Sharing needles with an infected person.   Having intimate contact with the genitals, mouth, or rectal areas of an infected person.  SYMPTOMS  Some people may not have any symptoms, but they can still pass the infection to others. Different STDs have different symptoms. Symptoms include:  Painful or bloody urination.   Pain in the pelvis, abdomen, vagina, anus, throat, or eyes.   Skin rash, itching, irritation, growths, or sores (lesions). These usually occur in the genital or anal area.   Abnormal vaginal discharge.   Penile discharge in men.   Soft, flesh-colored skin growths in the genital or anal area.   Fever.   Pain or bleeding during sexual intercourse.   Swollen glands in the groin area.   Yellow skin and eyes (jaundice). This is seen with hepatitis.  DIAGNOSIS  To make a diagnosis, your caregiver may:  Take a medical history.   Perform a physical exam.   Take a specimen (culture) to be examined.   Examine a sample of discharge under a microscope.   Perform blood tests.   Perform a Pap test, if this applies.   Perform a colposcopy.   Perform a laparoscopy.  TREATMENT   Chlamydia, gonorrhea, trichomonas, and syphilis can be cured with antibiotic medicine.   Genital herpes, hepatitis, and HIV can be treated, but not cured, with prescribed medicines. The medicines will lessen  the symptoms.   Genital warts from HPV can be treated with medicine or by freezing, burning (electrocautery), or surgery. Warts may come back.   HPV is a virus and cannot be cured with medicine or surgery.However, abnormal areas may be followed very closely by your caregiver and may be removed from the cervix, vagina, or vulva through office procedures or surgery.  If your diagnosis is confirmed, your recent sexual partners need treatment. This is true even if they are symptom-free or have a negative culture or evaluation. They should not have sex until their caregiver says it is okay. HOME CARE INSTRUCTIONS  All sexual partners should be informed, tested, and treated for all STDs.   Take your antibiotics as directed. Finish them even if you start to feel better.   Only take over-the-counter or prescription medicines for pain, discomfort, or fever as directed by your caregiver.   Rest.   Eat a balanced diet and drink enough fluids to keep your urine clear or pale yellow.   Do not have sex until treatment is completed and you have followed up with your caregiver. STDs should be checked after treatment.   Keep all follow-up appointments, Pap tests, and blood tests as directed by your caregiver.   Only use latex condoms and water-soluble lubricants during sexual activity. Do not use petroleum jelly or oils.   Avoid alcohol and illegal drugs.   Get vaccinated for HPV and hepatitis. If you have not received these vaccines   in the past, talk to your caregiver about whether one or both might be right for you.   Avoid risky sex practices that can break the skin.  The only way to avoid getting an STD is to avoid all sexual activity.Latex condoms and dental dams (for oral sex) will help lessen the risk of getting an STD, but will not completely eliminate the risk. SEEK MEDICAL CARE IF:   You have a fever.   You have any new or worsening symptoms.  Document Released: 08/24/2002 Document  Revised: 05/23/2011 Document Reviewed: 08/31/2010 [employer] Patient Information 2012 [employer]. 

## 2011-12-11 NOTE — Progress Notes (Signed)
Pulse 106.  Edema trace in legs and feet. C/o pelvic pain and occasional pressure. Vaginal d/c as curdy white; with itch, no odor. Requests STD testing- states possible exposure.

## 2011-12-11 NOTE — Progress Notes (Signed)
S:  +FM Pt partner indicated [facility]Ds [facility] but she does not know which ones.  She desires testing and treatment today.  She reports some vaginal discharge without odor.   O: size=dates Pelvic exam: Cervix pink, visually closed, without lesion, scant white creamy discharge, vaginal walls and external genitalia normal P: Pregnancy anticipatory guidance given Treat with Rocephin 250 mg IM and Azithromycin 1 g PO x1 dose today GC/Chlamydia and wet prep pending Discussed plan to evaluate HIV, RPR, and Hepatitis in 3 months

## 2011-12-12 LAB — WET PREP, GENITAL: Yeast Wet Prep HPF POC: NONE SEEN

## 2011-12-12 LAB — GC/CHLAMYDIA PROBE AMP, GENITAL: Chlamydia, DNA Probe: NEGATIVE

## 2011-12-13 ENCOUNTER — Telehealth: Payer: Self-pay | Admitting: General Practice

## 2011-12-13 LAB — CULTURE, OB URINE: Organism ID, Bacteria: NO GROWTH

## 2011-12-13 NOTE — Telephone Encounter (Signed)
Patient called and left her name, DOB, and phone number but no message

## 2011-12-13 NOTE — Telephone Encounter (Signed)
Pt wanted to know about her std results. I informed her that the testing was all negative.

## 2011-12-16 ENCOUNTER — Telehealth: Payer: Self-pay

## 2011-12-16 NOTE — Telephone Encounter (Signed)
Pt called and she informed me that when she wiped she had a little blood tinged discharge x 1 with no other symptoms.  I informed pt that it is normal but if it persists to brighter or more symptoms to please go to [facility].  Pt stated understanding and had no further questions.

## 2011-12-25 ENCOUNTER — Encounter: Payer: Self-pay | Admitting: Family

## 2011-12-27 ENCOUNTER — Telehealth: Payer: Self-pay | Admitting: *Deleted

## 2011-12-27 DIAGNOSIS — B373 Candidiasis of vulva and vagina: Secondary | ICD-10-CM

## 2011-12-27 MED ORDER — FLUCONAZOLE 150 MG PO TABS: 150.0000 mg | ORAL_TABLET | Freq: Once | ORAL | Status: AC

## 2011-12-27 NOTE — Telephone Encounter (Signed)
Patient called and stated that she is having a white discharge with lots of itching and irritation. Advised her that we can call in diflucan for her per ob standing orders. Informed patient that is symptoms do not get better she should call back and come in to be seen. Pt agrees with plan.

## 2012-01-08 ENCOUNTER — Telehealth: Payer: Self-pay | Admitting: Obstetrics and Gynecology

## 2012-01-08 ENCOUNTER — Encounter: Payer: Self-pay | Admitting: Physician Assistant

## 2012-01-08 NOTE — Telephone Encounter (Signed)
Patient called with message to return her call. RTC to patient and left message to call us back.

## 2012-01-09 NOTE — Telephone Encounter (Signed)
Called patient back. She stated concerns about sharp pains she feels in her vaginal area when she gets up from bed but subsides with re-positioning and walking. She reports no vaginal bleed and irregular contractions. Advised patient to go to [facility] if pain becomes persistent despite re-positioning and if she experiences vaginal bleed and regular contractions. Patient agrees and satisfied.

## 2012-01-15 ENCOUNTER — Ambulatory Visit (INDEPENDENT_AMBULATORY_CARE_PROVIDER_SITE_OTHER): Payer: Self-pay | Admitting: Advanced Practice Midwife

## 2012-01-15 VITALS — BP 126/76 | Temp 97.8°F | Wt 206.0 lb

## 2012-01-15 DIAGNOSIS — Z3480 Encounter for supervision of other normal pregnancy, unspecified trimester: Secondary | ICD-10-CM

## 2012-01-15 DIAGNOSIS — F329 Major depressive disorder, single episode, unspecified: Secondary | ICD-10-CM

## 2012-01-15 DIAGNOSIS — G4700 Insomnia, unspecified: Secondary | ICD-10-CM

## 2012-01-15 DIAGNOSIS — O99340 Other mental disorders complicating pregnancy, unspecified trimester: Secondary | ICD-10-CM

## 2012-01-15 DIAGNOSIS — O09899 Supervision of other high risk pregnancies, unspecified trimester: Secondary | ICD-10-CM

## 2012-01-15 DIAGNOSIS — IMO0002 Reserved for concepts with insufficient information to code with codable children: Secondary | ICD-10-CM | POA: Insufficient documentation

## 2012-01-15 LAB — POCT URINALYSIS DIP (DEVICE)
Bilirubin Urine: NEGATIVE
Glucose, UA: NEGATIVE mg/dL
Ketones, ur: NEGATIVE mg/dL
Nitrite: NEGATIVE

## 2012-01-15 MED ORDER — ZOLPIDEM TARTRATE 5 MG PO TABS: 5.0000 mg | ORAL_TABLET | Freq: Every evening | ORAL | Status: DC | PRN

## 2012-01-15 MED ORDER — TRAMADOL HCL 50 MG PO TABS: 50.0000 mg | ORAL_TABLET | Freq: Four times a day (QID) | ORAL | Status: AC | PRN
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
## 2012-01-15 NOTE — Patient Instructions (Addendum)
Pregnancy - Third Trimester The third trimester of pregnancy (the last 3 months) is a period of the most rapid growth for you and your baby. The baby approaches a length of 20 inches and a weight of 6 to 10 pounds. The baby is adding on fat and getting ready for life outside your body. While inside, babies have periods of sleeping and waking, suck their thumbs, and hiccups. You can often feel small contractions of the uterus. This is false labor. It is also called Braxton-Hicks contractions. This is like a practice for labor. The usual problems in this stage of pregnancy include more difficulty breathing, swelling of the hands and feet from water retention, and having to urinate more often because of the uterus and baby pressing on your bladder.  PRENATAL EXAMS  Blood work may continue to be done during prenatal exams. These tests are done to check on your health and the probable health of your baby. Blood work is used to follow your blood levels (hemoglobin). Anemia (low hemoglobin) is common during pregnancy. Iron and vitamins are given to help prevent this. You may also continue to be checked for diabetes. Some of the past blood tests may be done again.   The size of the uterus is measured during each visit. This makes sure your baby is growing properly according to your pregnancy dates.   Your blood pressure is checked every prenatal visit. This is to make sure you are not getting toxemia.   Your urine is checked every prenatal visit for infection, diabetes and protein.   Your weight is checked at each visit. This is done to make sure gains are happening at the suggested rate and that you and your baby are growing normally.   Sometimes, an ultrasound is performed to confirm the position and the proper growth and development of the baby. This is a test done that bounces harmless sound waves off the baby so your caregiver can more accurately determine due dates.   Discuss the type of pain  medication and anesthesia you will have during your labor and delivery.   Discuss the possibility and anesthesia if a Cesarean Section might be necessary.   Inform your caregiver if there is any mental or physical violence at home.  Sometimes, a specialized non-stress test, contraction stress test and biophysical profile are done to make sure the baby is not having a problem. Checking the amniotic fluid surrounding the baby is called an amniocentesis. The amniotic fluid is removed by sticking a needle into the belly (abdomen). This is sometimes done near the end of pregnancy if an early delivery is required. In this case, it is done to help make sure the baby's lungs are mature enough for the baby to live outside of the womb. If the lungs are not mature and it is unsafe to deliver the baby, an injection of cortisone medication is given to the mother 1 to 2 days before the delivery. This helps the baby's lungs mature and makes it safer to deliver the baby. CHANGES OCCURING IN THE THIRD TRIMESTER OF PREGNANCY Your body goes through many changes during pregnancy. They vary from person to person. Talk to your caregiver about changes you notice and are concerned about.  During the last trimester, you have probably had an increase in your appetite. It is normal to have cravings for certain foods. This varies from person to person and pregnancy to pregnancy.   You may begin to get stretch marks on your hips,   abdomen, and breasts. These are normal changes in the body during pregnancy. There are no exercises or medications to take which prevent this change.   Constipation may be treated with a stool softener or adding bulk to your diet. Drinking lots of fluids, fiber in vegetables, fruits, and whole grains are helpful.   Exercising is also helpful. If you have been very active up until your pregnancy, most of these activities can be continued during your pregnancy. If you have been less active, it is helpful  to start an exercise program such as walking. Consult your caregiver before starting exercise programs.   Avoid all smoking, alcohol, un-prescribed drugs, herbs and "street drugs" during your pregnancy. These chemicals affect the formation and growth of the baby. Avoid chemicals throughout the pregnancy to ensure the delivery of a healthy infant.   Backache, varicose veins and hemorrhoids may develop or get worse.   You will tire more easily in the third trimester, which is normal.   The baby's movements may be stronger and more often.   You may become short of breath easily.   Your belly button may stick out.   A yellow discharge may leak from your breasts called colostrum.   You may have a bloody mucus discharge. This usually occurs a few days to a week before labor begins.  HOME CARE INSTRUCTIONS   Keep your caregiver's appointments. Follow your caregiver's instructions regarding medication use, exercise, and diet.   During pregnancy, you are providing food for you and your baby. Continue to eat regular, well-balanced meals. Choose foods such as meat, fish, milk and other low fat dairy products, vegetables, fruits, and whole-grain breads and cereals. Your caregiver will tell you of the ideal weight gain.   A physical sexual relationship may be continued throughout pregnancy if there are no other problems such as early (premature) leaking of amniotic fluid from the membranes, vaginal bleeding, or belly (abdominal) pain.   Exercise regularly if there are no restrictions. Check with your caregiver if you are unsure of the safety of your exercises. Greater weight gain will occur in the last 2 trimesters of pregnancy. Exercising helps:   Control your weight.   Get you in shape for labor and delivery.   You lose weight after you deliver.   Rest a lot with legs elevated, or as needed for leg cramps or low back pain.   Wear a good support or jogging bra for breast tenderness during  pregnancy. This may help if worn during sleep. Pads or tissues may be used in the bra if you are leaking colostrum.   Do not use hot tubs, steam rooms, or saunas.   Wear your seat belt when driving. This protects you and your baby if you are in an accident.   Avoid raw meat, cat litter boxes and soil used by cats. These carry germs that can cause birth defects in the baby.   It is easier to loose urine during pregnancy. Tightening up and strengthening the pelvic muscles will help with this problem. You can practice stopping your urination while you are going to the bathroom. These are the same muscles you need to strengthen. It is also the muscles you would use if you were trying to stop from passing gas. You can practice tightening these muscles up 10 times a set and repeating this about 3 times per day. Once you know what muscles to tighten up, do not perform these exercises during urination. It is more likely   to cause an infection by backing up the urine.   Ask for help if you have financial, counseling or nutritional needs during pregnancy. Your caregiver will be able to offer counseling for these needs as well as refer you for other special needs.   Make a list of emergency phone numbers and have them available.   Plan on getting help from family or friends when you go home from the hospital.   Make a trial run to the hospital.   Take prenatal classes with the father to understand, practice and ask questions about the labor and delivery.   Prepare the baby's room/nursery.   Do not travel out of the city unless it is absolutely necessary and with the advice of your caregiver.   Wear only low or no heal shoes to have better balance and prevent falling.  MEDICATIONS AND DRUG USE IN PREGNANCY  Take prenatal vitamins as directed. The vitamin should contain 1 milligram of folic acid. Keep all vitamins out of reach of children. Only a couple vitamins or tablets containing iron may be fatal  to a baby or young child when ingested.   Avoid use of all medications, including herbs, over-the-counter medications, not prescribed or suggested by your caregiver. Only take over-the-counter or prescription medicines for pain, discomfort, or fever as directed by your caregiver. Do not use aspirin, ibuprofen (Motrin, Advil, Nuprin) or naproxen (Aleve) unless OK'd by your caregiver.   Let your caregiver also know about herbs you may be using.   Alcohol is related to a number of birth defects. This includes fetal alcohol syndrome. All alcohol, in any form, should be avoided completely. Smoking will cause low birth rate and premature babies.   Street/illegal drugs are very harmful to the baby. They are absolutely forbidden. A baby born to an addicted mother will be addicted at birth. The baby will go through the same withdrawal an adult does.  SEEK MEDICAL CARE IF: You have any concerns or worries during your pregnancy. It is better to call with your questions if you feel they cannot wait, rather than worry about them. DECISIONS ABOUT CIRCUMCISION You may or may not know the sex of your baby. If you know your baby is a boy, it may be time to think about circumcision. Circumcision is the removal of the foreskin of the penis. This is the skin that covers the sensitive end of the penis. There is no proven medical need for this. Often this decision is made on what is popular at the time or based upon religious beliefs and social issues. You can discuss these issues with your caregiver or pediatrician. SEEK IMMEDIATE MEDICAL CARE IF:   An unexplained oral temperature above 102 F (38.9 C) develops, or as your caregiver suggests.   You have leaking of fluid from the vagina (birth canal). If leaking membranes are suspected, take your temperature and tell your caregiver of this when you call.   There is vaginal spotting, bleeding or passing clots. Tell your caregiver of the amount and how many pads are  used.   You develop a bad smelling vaginal discharge with a change in the color from clear to white.   You develop vomiting that lasts more than 24 hours.   You develop chills or fever.   You develop shortness of breath.   You develop burning on urination.   You loose more than 2 pounds of weight or gain more than 2 pounds of weight or as suggested by your   caregiver.   You notice sudden swelling of your face, hands, and feet or legs.   You develop belly (abdominal) pain. Round ligament discomfort is a common non-cancerous (benign) cause of abdominal pain in pregnancy. Your caregiver still must evaluate you.   You develop a severe headache that does not go away.   You develop visual problems, blurred or double vision.   If you have not felt your baby move for more than 1 hour. If you think the baby is not moving as much as usual, eat something with sugar in it and lie down on your left side for an hour. The baby should move at least 4 to 5 times per hour. Call right away if your baby moves less than that.   You fall, are in a car accident or any kind of trauma.   There is mental or physical violence at home.  Document Released: 05/28/2001 Document Revised: 05/23/2011 Document Reviewed: 11/30/2008 [facility] Patient Information 2012 [place], [place].If You Are the Victim of Domestic Violence THE POLICE CAN HELP YOU:  Get to a safe place away from the violence.   Get information on how the court can help protect you against the violence.   Get medical care for injuries you or your children may have.   Get necessary belongings from your home for you and your children.   Get copies of police reports about the violence.   File a complaint in criminal court.   Find where local criminal and family courts are located.  THE COURTS CAN HELP YOU  If the person who harmed or threatened you is a family member or someone you have had a child with, then you have the right to take your case  to the criminal courts, the [employer], or both.   If you and the abuser are not related, were not ever married, and do not have a child in common, then your case can be heard only in the criminal court.   The forms you need are available from the [facility] and the criminal court.   The courts can decide to provide a temporary order of protection for:   You.   Your children.   Any witnesses who may request one.   The [employer] may appoint a lawyer to help you in court if it is found that you cannot afford one.   The Family Court may order temporary child support and temporary custody of your children.  LAWS VARY FROM STATE TO STATE. YOU WILL NEED TO CHECK THE LAWS IN YOUR STATE.  You may request that the law enforcement officer assist in:   Providing for your safety and that of your children. This includes providing information on how to obtain a temporary order of protection.   Obtaining essential personal property.   Locating and taking you and your children to a safe place within the officer's jurisdiction. This includes but is not limited to a domestic violence program, a family member's or a friend's residence, or a similar place of safety.   Obtaining medical treatment for you and your children.   When the officer's jurisdiction is more than a single county, you may ask the officer to take you or make arrangements to take you and your children to a place of safety in the county where the incident occurred.   You may request a copy of any incident reports at no cost from the law enforcement agency.   You have the right to  seek legal counsel of your own choosing. If you proceed in family court and if it is determined that you cannot afford an attorney one must be appointed to represent you without cost to you.   You may ask the district attorney or a [employer] to file a criminal complaint. You also have the right to have your petition and request for an  order of protection filed on the same day you appear in court. Such request must be heard that same day or the next day court is in session.   Either court may issue an order of protection from conduct constituting a family offense. This could include an order for the respondent or defendant to stay away from you and your children.   If the family court is not in session, you may seek immediate assistance from the criminal court in obtaining an order of protection. The forms you need to obtain an order of protection are available from the family court and the local criminal court. Note that filing a criminal complaint or a family court petition containing allegations (claims) that are knowingly false is a crime.  Call your local domestic violence program for additional information and support. Document Released: 08/24/2003 Document Revised: 05/23/2011 Document Reviewed: 04/13/2007 [facility] Patient Information 2012 [place], [place].

## 2012-01-15 NOTE — Progress Notes (Signed)
Pulse 91 Patient reports back pain and ligament pain that prevents her from sleeping, also reports pelvic pressure and "braxton-hicks" Patient states she would like medication for the pain and to help her sleep; not taking PNV- makes her feel bad  Patient states she would also like to be checked for STDs again, reports occasional white d/c and itching

## 2012-01-16 LAB — WET PREP, GENITAL
Clue Cells Wet Prep HPF POC: NONE SEEN
Trich, Wet Prep: NONE SEEN

## 2012-01-17 ENCOUNTER — Telehealth: Payer: Self-pay | Admitting: *Deleted

## 2012-01-17 NOTE — Telephone Encounter (Signed)
Pt left message requesting test results.  

## 2012-01-17 NOTE — Telephone Encounter (Signed)
Called pt and informed her of negative test results for the following: GC/Chlamydia and wet prep. Pt voiced understanding.

## 2012-01-21 ENCOUNTER — Encounter: Payer: Self-pay | Admitting: Obstetrics and Gynecology

## 2012-01-22 ENCOUNTER — Inpatient Hospital Stay (HOSPITAL_COMMUNITY)
Admission: AD | Admit: 2012-01-22 | Discharge: 2012-01-23 | Disposition: A | Discharge status: Home / Self Care | Payer: Self-pay | Source: Ambulatory Visit | Attending: Obstetrics and Gynecology | Admitting: Obstetrics and Gynecology

## 2012-01-22 DIAGNOSIS — Z8619 Personal history of other infectious and parasitic diseases: Secondary | ICD-10-CM

## 2012-01-22 DIAGNOSIS — Z3480 Encounter for supervision of other normal pregnancy, unspecified trimester: Secondary | ICD-10-CM

## 2012-01-22 DIAGNOSIS — N949 Unspecified condition associated with female genital organs and menstrual cycle: Secondary | ICD-10-CM

## 2012-01-22 DIAGNOSIS — K3189 Other diseases of stomach and duodenum: Secondary | ICD-10-CM | POA: Insufficient documentation

## 2012-01-22 DIAGNOSIS — O99891 Other specified diseases and conditions complicating pregnancy: Secondary | ICD-10-CM | POA: Insufficient documentation

## 2012-01-22 DIAGNOSIS — R109 Unspecified abdominal pain: Secondary | ICD-10-CM | POA: Insufficient documentation

## 2012-01-22 NOTE — [facility] Note (Signed)
Pt reports pain for the last few days, mostly at night. When asked the patient states pain is from feet up to her mid back. States she "just can't stand it anymore"

## 2012-01-23 ENCOUNTER — Encounter (HOSPITAL_COMMUNITY): Payer: Self-pay | Admitting: *Deleted

## 2012-01-23 DIAGNOSIS — R1084 Generalized abdominal pain: Secondary | ICD-10-CM

## 2012-01-23 DIAGNOSIS — Z331 Pregnant state, incidental: Secondary | ICD-10-CM

## 2012-01-23 LAB — URINALYSIS, ROUTINE W REFLEX MICROSCOPIC
Bilirubin Urine: NEGATIVE
Glucose, UA: NEGATIVE mg/dL
Hgb urine dipstick: NEGATIVE
Ketones, ur: NEGATIVE mg/dL
Protein, ur: NEGATIVE mg/dL

## 2012-01-23 MED ORDER — GI COCKTAIL ~~LOC~~
30.0000 mL | Freq: Once | ORAL | Status: AC
  Administered 2012-01-23: 30 mL via ORAL
  Filled 2012-01-23: qty 30

## 2012-01-23 MED ORDER — ACETAMINOPHEN 500 MG PO TABS
1000.0000 mg | ORAL_TABLET | Freq: Once | ORAL | Status: AC
  Administered 2012-01-23: 1000 mg via ORAL
  Filled 2012-01-23: qty 2

## 2012-01-23 NOTE — Progress Notes (Signed)
Pt states she has body pains

## 2012-01-23 NOTE — [facility] Provider Note (Signed)
Chief Complaint:  Generalized Body Aches   Lauren Hayden is  26 y.o. [phone] at [redacted]w[redacted]d presents complaining of Generalized Body Aches but primarily lower abdominal and pelvic discomfort.  She also c/o dyspepsia. She reports +FM and no ROM or VB. Obstetrical/Gynecological History: Menstrual History: OB History    Grav Para Term Preterm Abortions TAB SAB Ect Mult Living   7 2 2  4 4    2        Patient's last menstrual period was 07/22/2011.     Past Medical History: Past Medical History  Diagnosis Date  . Depression     Past Surgical History: Past Surgical History  Procedure Date  . Dilation and curettage of uterus     Family History: Family History  Problem Relation Age of Onset  . Anesthesia problems Neg Hx   . Hypotension Neg Hx   . Malignant hyperthermia Neg Hx   . Pseudochol deficiency Neg Hx   . Diabetes Mother   . Hypertension Mother   . Hypertension Father   . Asthma Sister     Social History: History  Substance Use Topics  . Smoking status: Former Smoker    Types: Cigars    Quit date: 08/23/2011  . Smokeless tobacco: Never Used  . Alcohol Use: No     occ    Allergies: No Known Allergies  Meds:  Prescriptions prior to admission  Medication Sig Dispense Refill  . Multiple Vitamin (MULTIVITAMIN) tablet Take 1 tablet by mouth daily.      . Prenatal Vit-Fe Fumarate-FA (PRENATAL MULTIVITAMIN) TABS Take 1 tablet by mouth daily.  30 tablet  0  . traMADol (ULTRAM) 50 MG tablet Take 1 tablet (50 mg total) by mouth every 6 (six) hours as needed for pain.  15 tablet  0  . zolpidem (AMBIEN) 5 MG tablet Take 1 tablet (5 mg total) by mouth at bedtime as needed for sleep.  3 tablet  0    Review of Systems - Please refer to the aforementioned patients' reports.     Physical Exam  Blood pressure 114/64, pulse 102, temperature 97.6 F (36.4 C), temperature source Oral, resp. rate 18, height 5\' 3"  (1.6 m), weight 97.07 kg (214 lb), last menstrual period 07/22/2011,  SpO2 100.00%. GENERAL: Well-developed, well-nourished female in no acute distress.   HEART: Regular rate and rhythm. ABDOMEN: Soft, nontender, nondistended, gravid.  EXTREMITIES: Nontender, no edema, 2+ distal pulses. CERVICAL EXAM: deferred   FHT:  Baseline rate 150 bpm   Variability moderate  Accelerations PRESENT  Decelerations absent Contractions: absent   Labs: Recent Results (from the past 24 hour(s))  URINALYSIS, ROUTINE W REFLEX MICROSCOPIC   Collection Time   01/22/12 11:03 PM      Component Value Range   Color, Urine YELLOW  YELLOW   APPearance CLEAR  CLEAR   Specific Gravity, Urine >1.030 (*) 1.005 - 1.030   pH 6.0  5.0 - 8.0   Glucose, UA NEGATIVE  NEGATIVE mg/dL   Hgb urine dipstick NEGATIVE  NEGATIVE   Bilirubin Urine NEGATIVE  NEGATIVE   Ketones, ur NEGATIVE  NEGATIVE mg/dL   Protein, ur NEGATIVE  NEGATIVE mg/dL   Urobilinogen, UA 0.2  0.0 - 1.0 mg/dL   Nitrite NEGATIVE  NEGATIVE   Leukocytes, UA NEGATIVE  NEGATIVE   Imaging Studies:  No results found.  Assessment: Lauren Hayden is  26 y.o. [phone] at [redacted]w[redacted]d presents with round ligament pain and dyspepsia.  Plan: Tylenol and GI Cocktail Discharge home Suggested  the use of OTC Prilosec d/t pt without insurance yet  Lauren Hayden 8/8/20131:16 AM

## 2012-01-28 ENCOUNTER — Encounter: Payer: Self-pay | Admitting: Obstetrics and Gynecology

## 2012-01-29 ENCOUNTER — Encounter: Payer: Self-pay | Admitting: Obstetrics and Gynecology

## 2012-02-12 ENCOUNTER — Other Ambulatory Visit: Payer: Self-pay | Admitting: Family Medicine

## 2012-02-12 ENCOUNTER — Ambulatory Visit (INDEPENDENT_AMBULATORY_CARE_PROVIDER_SITE_OTHER): Payer: Self-pay | Admitting: Obstetrics and Gynecology

## 2012-02-12 ENCOUNTER — Encounter: Payer: Self-pay | Admitting: Obstetrics and Gynecology

## 2012-02-12 VITALS — BP 106/66 | Temp 96.9°F | Wt 208.6 lb

## 2012-02-12 DIAGNOSIS — O09899 Supervision of other high risk pregnancies, unspecified trimester: Secondary | ICD-10-CM

## 2012-02-12 DIAGNOSIS — Z8619 Personal history of other infectious and parasitic diseases: Secondary | ICD-10-CM

## 2012-02-12 DIAGNOSIS — N760 Acute vaginitis: Secondary | ICD-10-CM

## 2012-02-12 DIAGNOSIS — R300 Dysuria: Secondary | ICD-10-CM

## 2012-02-12 DIAGNOSIS — Z3480 Encounter for supervision of other normal pregnancy, unspecified trimester: Secondary | ICD-10-CM

## 2012-02-12 LAB — POCT URINALYSIS DIP (DEVICE)
Glucose, UA: NEGATIVE mg/dL
Protein, ur: NEGATIVE mg/dL
Specific Gravity, Urine: >=1.03 (ref 1.005–1.030)
Urobilinogen, UA: 0.2 mg/dL (ref 0.0–1.0)
pH: 5.5 (ref 5.0–8.0)

## 2012-02-12 NOTE — Progress Notes (Signed)
P = 107 Edema in feet Pressure on right side, LBP

## 2012-02-12 NOTE — Patient Instructions (Signed)
Pregnancy - Third Trimester The third trimester of pregnancy (the last 3 months) is a period of the most rapid growth for you and your baby. The baby approaches a length of 20 inches and a weight of 6 to 10 pounds. The baby is adding on fat and getting ready for life outside your body. While inside, babies have periods of sleeping and waking, suck their thumbs, and hiccups. You can often feel small contractions of the uterus. This is false labor. It is also called Braxton-Hicks contractions. This is like a practice for labor. The usual problems in this stage of pregnancy include more difficulty breathing, swelling of the hands and feet from water retention, and having to urinate more often because of the uterus and baby pressing on your bladder.  PRENATAL EXAMS  Blood work may continue to be done during prenatal exams. These tests are done to check on your health and the probable health of your baby. Blood work is used to follow your blood levels (hemoglobin). Anemia (low hemoglobin) is common during pregnancy. Iron and vitamins are given to help prevent this. You may also continue to be checked for diabetes. Some of the past blood tests may be done again.   The size of the uterus is measured during each visit. This makes sure your baby is growing properly according to your pregnancy dates.   Your blood pressure is checked every prenatal visit. This is to make sure you are not getting toxemia.   Your urine is checked every prenatal visit for infection, diabetes and protein.   Your weight is checked at each visit. This is done to make sure gains are happening at the suggested rate and that you and your baby are growing normally.   Sometimes, an ultrasound is performed to confirm the position and the proper growth and development of the baby. This is a test done that bounces harmless sound waves off the baby so your caregiver can more accurately determine due dates.   Discuss the type of pain  medication and anesthesia you will have during your labor and delivery.   Discuss the possibility and anesthesia if a Cesarean Section might be necessary.   Inform your caregiver if there is any mental or physical violence at home.  Sometimes, a specialized non-stress test, contraction stress test and biophysical profile are done to make sure the baby is not having a problem. Checking the amniotic fluid surrounding the baby is called an amniocentesis. The amniotic fluid is removed by sticking a needle into the belly (abdomen). This is sometimes done near the end of pregnancy if an early delivery is required. In this case, it is done to help make sure the baby's lungs are mature enough for the baby to live outside of the womb. If the lungs are not mature and it is unsafe to deliver the baby, an injection of cortisone medication is given to the mother 1 to 2 days before the delivery. This helps the baby's lungs mature and makes it safer to deliver the baby. CHANGES OCCURING IN THE THIRD TRIMESTER OF PREGNANCY Your body goes through many changes during pregnancy. They vary from person to person. Talk to your caregiver about changes you notice and are concerned about.  During the last trimester, you have probably had an increase in your appetite. It is normal to have cravings for certain foods. This varies from person to person and pregnancy to pregnancy.   You may begin to get stretch marks on your hips,   abdomen, and breasts. These are normal changes in the body during pregnancy. There are no exercises or medications to take which prevent this change.   Constipation may be treated with a stool softener or adding bulk to your diet. Drinking lots of fluids, fiber in vegetables, fruits, and whole grains are helpful.   Exercising is also helpful. If you have been very active up until your pregnancy, most of these activities can be continued during your pregnancy. If you have been less active, it is helpful  to start an exercise program such as walking. Consult your caregiver before starting exercise programs.   Avoid all smoking, alcohol, un-prescribed drugs, herbs and "street drugs" during your pregnancy. These chemicals affect the formation and growth of the baby. Avoid chemicals throughout the pregnancy to ensure the delivery of a healthy infant.   Backache, varicose veins and hemorrhoids may develop or get worse.   You will tire more easily in the third trimester, which is normal.   The baby's movements may be stronger and more often.   You may become short of breath easily.   Your belly button may stick out.   A yellow discharge may leak from your breasts called colostrum.   You may have a bloody mucus discharge. This usually occurs a few days to a week before labor begins.  HOME CARE INSTRUCTIONS   Keep your caregiver's appointments. Follow your caregiver's instructions regarding medication use, exercise, and diet.   During pregnancy, you are providing food for you and your baby. Continue to eat regular, well-balanced meals. Choose foods such as meat, fish, milk and other low fat dairy products, vegetables, fruits, and whole-grain breads and cereals. Your caregiver will tell you of the ideal weight gain.   A physical sexual relationship may be continued throughout pregnancy if there are no other problems such as early (premature) leaking of amniotic fluid from the membranes, vaginal bleeding, or belly (abdominal) pain.   Exercise regularly if there are no restrictions. Check with your caregiver if you are unsure of the safety of your exercises. Greater weight gain will occur in the last 2 trimesters of pregnancy. Exercising helps:   Control your weight.   Get you in shape for labor and delivery.   You lose weight after you deliver.   Rest a lot with legs elevated, or as needed for leg cramps or low back pain.   Wear a good support or jogging bra for breast tenderness during  pregnancy. This may help if worn during sleep. Pads or tissues may be used in the bra if you are leaking colostrum.   Do not use hot tubs, steam rooms, or saunas.   Wear your seat belt when driving. This protects you and your baby if you are in an accident.   Avoid raw meat, cat litter boxes and soil used by cats. These carry germs that can cause birth defects in the baby.   It is easier to loose urine during pregnancy. Tightening up and strengthening the pelvic muscles will help with this problem. You can practice stopping your urination while you are going to the bathroom. These are the same muscles you need to strengthen. It is also the muscles you would use if you were trying to stop from passing gas. You can practice tightening these muscles up 10 times a set and repeating this about 3 times per day. Once you know what muscles to tighten up, do not perform these exercises during urination. It is more likely   to cause an infection by backing up the urine.   Ask for help if you have financial, counseling or nutritional needs during pregnancy. Your caregiver will be able to offer counseling for these needs as well as refer you for other special needs.   Make a list of emergency phone numbers and have them available.   Plan on getting help from family or friends when you go home from the hospital.   Make a trial run to the hospital.   Take prenatal classes with the father to understand, practice and ask questions about the labor and delivery.   Prepare the baby's room/nursery.   Do not travel out of the city unless it is absolutely necessary and with the advice of your caregiver.   Wear only low or no heal shoes to have better balance and prevent falling.  MEDICATIONS AND DRUG USE IN PREGNANCY  Take prenatal vitamins as directed. The vitamin should contain 1 milligram of folic acid. Keep all vitamins out of reach of children. Only a couple vitamins or tablets containing iron may be fatal  to a baby or young child when ingested.   Avoid use of all medications, including herbs, over-the-counter medications, not prescribed or suggested by your caregiver. Only take over-the-counter or prescription medicines for pain, discomfort, or fever as directed by your caregiver. Do not use aspirin, ibuprofen (Motrin, Advil, Nuprin) or naproxen (Aleve) unless OK'd by your caregiver.   Let your caregiver also know about herbs you may be using.   Alcohol is related to a number of birth defects. This includes fetal alcohol syndrome. All alcohol, in any form, should be avoided completely. Smoking will cause low birth rate and premature babies.   Street/illegal drugs are very harmful to the baby. They are absolutely forbidden. A baby born to an addicted mother will be addicted at birth. The baby will go through the same withdrawal an adult does.  SEEK MEDICAL CARE IF: You have any concerns or worries during your pregnancy. It is better to call with your questions if you feel they cannot wait, rather than worry about them. DECISIONS ABOUT CIRCUMCISION You may or may not know the sex of your baby. If you know your baby is a boy, it may be time to think about circumcision. Circumcision is the removal of the foreskin of the penis. This is the skin that covers the sensitive end of the penis. There is no proven medical need for this. Often this decision is made on what is popular at the time or based upon religious beliefs and social issues. You can discuss these issues with your caregiver or pediatrician. SEEK IMMEDIATE MEDICAL CARE IF:   An unexplained oral temperature above 102 F (38.9 C) develops, or as your caregiver suggests.   You have leaking of fluid from the vagina (birth canal). If leaking membranes are suspected, take your temperature and tell your caregiver of this when you call.   There is vaginal spotting, bleeding or passing clots. Tell your caregiver of the amount and how many pads are  used.   You develop a bad smelling vaginal discharge with a change in the color from clear to white.   You develop vomiting that lasts more than 24 hours.   You develop chills or fever.   You develop shortness of breath.   You develop burning on urination.   You loose more than 2 pounds of weight or gain more than 2 pounds of weight or as suggested by your   caregiver.   You notice sudden swelling of your face, hands, and feet or legs.   You develop belly (abdominal) pain. Round ligament discomfort is a common non-cancerous (benign) cause of abdominal pain in pregnancy. Your caregiver still must evaluate you.   You develop a severe headache that does not go away.   You develop visual problems, blurred or double vision.   If you have not felt your baby move for more than 1 hour. If you think the baby is not moving as much as usual, eat something with sugar in it and lie down on your left side for an hour. The baby should move at least 4 to 5 times per hour. Call right away if your baby moves less than that.   You fall, are in a car accident or any kind of trauma.   There is mental or physical violence at home.  Document Released: 05/28/2001 Document Revised: 05/23/2011 Document Reviewed: 11/30/2008 [employer] Patient Information 2012 [employer]. 

## 2012-02-12 NOTE — Progress Notes (Signed)
Urinary urgency and +LE so will get C&S, force fluids. Vulvar itch and white discharge. Spec: thick, whitish discharge.  Wet Prep collected and to be sent to lab.  Will also send Urine for culture.  Will discuss results with patient.

## 2012-02-13 ENCOUNTER — Other Ambulatory Visit: Payer: Self-pay

## 2012-02-13 LAB — WET PREP, GENITAL: Clue Cells Wet Prep HPF POC: NONE SEEN

## 2012-02-18 ENCOUNTER — Telehealth: Payer: Self-pay | Admitting: Medical

## 2012-02-18 NOTE — Telephone Encounter (Signed)
Called pt and left message that we are returning her call  to return the call to the clinics.

## 2012-02-18 NOTE — Telephone Encounter (Signed)
Patient called stated name and DOB and could not understand the reason for the call.

## 2012-02-18 NOTE — Telephone Encounter (Signed)
Patient returned call to clinic asking for test results.

## 2012-02-19 NOTE — Telephone Encounter (Signed)
Called patient, she states she wants test results from last week of wet prep- given results of wet prep(negative ). Patient requests prescription for pill for yeast even though test negative-per review of chart exam last week shows thick white discharge with vulvar itiching without prescription- patient states wasn't really bothering her  Then, but is now- c/o itching and still has white discharge. Offered to eprescribe diflucan, but patient states doesn't have medicaid right now and wants a paper prescription and wants to pick it up tomorrow at her appointment. Informed her that should not be a problem.

## 2012-02-20 ENCOUNTER — Other Ambulatory Visit: Payer: Self-pay

## 2012-02-24 ENCOUNTER — Telehealth: Payer: Self-pay | Admitting: *Deleted

## 2012-02-24 NOTE — Telephone Encounter (Signed)
Pt called and left a message that I was unable to understand. I attempted to call her back at the number she left on our voicemail but the number is now not in service.

## 2012-02-26 ENCOUNTER — Ambulatory Visit (INDEPENDENT_AMBULATORY_CARE_PROVIDER_SITE_OTHER): Payer: Self-pay | Admitting: Advanced Practice Midwife

## 2012-02-26 VITALS — BP 99/61 | Temp 98.6°F | Wt 213.5 lb

## 2012-02-26 DIAGNOSIS — O09899 Supervision of other high risk pregnancies, unspecified trimester: Secondary | ICD-10-CM

## 2012-02-26 DIAGNOSIS — Z3480 Encounter for supervision of other normal pregnancy, unspecified trimester: Secondary | ICD-10-CM

## 2012-02-26 LAB — POCT URINALYSIS DIP (DEVICE)
Hgb urine dipstick: NEGATIVE
Protein, ur: NEGATIVE mg/dL
Specific Gravity, Urine: 1.02 (ref 1.005–1.030)
Urobilinogen, UA: 0.2 mg/dL (ref 0.0–1.0)

## 2012-02-26 MED ORDER — FLUCONAZOLE 150 MG PO TABS: 150.0000 mg | ORAL_TABLET | Freq: Once | ORAL | Status: AC

## 2012-02-26 NOTE — Progress Notes (Signed)
Pulse 102 1hr gtt today due at 1140. Pt is c/o white discharge with itching.

## 2012-02-26 NOTE — Progress Notes (Signed)
Patient c/o whitish discharge.  Vaginal pruritus.  No vaginal bleeding.  No other complaints.  Patient has hx of yeast infection left untreated.  Waiting on medicaid for prescription assistance.

## 2012-02-29 LAB — CULTURE, OB URINE

## 2012-03-03 ENCOUNTER — Telehealth: Payer: Self-pay | Admitting: *Deleted

## 2012-03-03 MED ORDER — PENICILLIN V POTASSIUM 500 MG PO TABS: 500.0000 mg | ORAL_TABLET | Freq: Four times a day (QID) | ORAL | Status: DC

## 2012-03-03 NOTE — Addendum Note (Signed)
Addended by: Aviva Signs on: 03/03/2012 12:46 AM   Modules accepted: Orders

## 2012-03-03 NOTE — Telephone Encounter (Signed)
Message copied by Jill Side on Tue Mar 03, 2012 10:05 AM ------      Message from: Aviva Signs      Created: Tue Mar 03, 2012 12:43 AM      Regarding: Needs rx for GBS urine       2000 col of GBS on 02/26/12            Needs Rx penicillin             Will put one in if you can call her to pick up

## 2012-03-03 NOTE — Telephone Encounter (Signed)
Called pt and informed her of need for antibiotic. She also stated that she wants medication called in because she thinks she has a yeast infection but was told recently that her test was negative. I explained that she would need evaluation and I cannot call in that medication.  She stated that she is urinating frequently and has irritation "down there".  I told pt that these sx may be from her UTI.  She will take medication and if not improved will call back to the clinic. Pt voiced understanding.

## 2012-03-06 ENCOUNTER — Telehealth: Payer: Self-pay | Admitting: *Deleted

## 2012-03-06 NOTE — Telephone Encounter (Signed)
Pt left message requesting call back.

## 2012-03-08 ENCOUNTER — Encounter (HOSPITAL_COMMUNITY): Payer: Self-pay | Admitting: Emergency Medicine

## 2012-03-08 ENCOUNTER — Emergency Department (HOSPITAL_COMMUNITY)
Admission: EM | Admit: 2012-03-08 | Discharge: 2012-03-08 | Disposition: A | Discharge status: Home / Self Care | Payer: Self-pay | Attending: Emergency Medicine | Admitting: Emergency Medicine

## 2012-03-08 DIAGNOSIS — Z8249 Family history of ischemic heart disease and other diseases of the circulatory system: Secondary | ICD-10-CM | POA: Insufficient documentation

## 2012-03-08 DIAGNOSIS — Z833 Family history of diabetes mellitus: Secondary | ICD-10-CM | POA: Insufficient documentation

## 2012-03-08 DIAGNOSIS — W57XXXA Bitten or stung by nonvenomous insect and other nonvenomous arthropods, initial encounter: Secondary | ICD-10-CM | POA: Insufficient documentation

## 2012-03-08 DIAGNOSIS — F329 Major depressive disorder, single episode, unspecified: Secondary | ICD-10-CM | POA: Insufficient documentation

## 2012-03-08 DIAGNOSIS — Z87891 Personal history of nicotine dependence: Secondary | ICD-10-CM | POA: Insufficient documentation

## 2012-03-08 DIAGNOSIS — Z8489 Family history of other specified conditions: Secondary | ICD-10-CM | POA: Insufficient documentation

## 2012-03-08 DIAGNOSIS — Z825 Family history of asthma and other chronic lower respiratory diseases: Secondary | ICD-10-CM | POA: Insufficient documentation

## 2012-03-08 DIAGNOSIS — F3289 Other specified depressive episodes: Secondary | ICD-10-CM | POA: Insufficient documentation

## 2012-03-08 DIAGNOSIS — T148 Other injury of unspecified body region: Secondary | ICD-10-CM | POA: Insufficient documentation

## 2012-03-08 NOTE — ED Provider Notes (Signed)
History  This chart was scribed for Arley Phenix, MD by Bennett Scrape. This patient was seen in room PED9/PED09 and the patient's care was started at 11:17PM.  CSN: [id number]  Arrival date & time 03/08/12  2144   First MD Initiated Contact with Patient 03/08/12 2317      Chief Complaint  Patient presents with  . Rash    The history is provided by the patient. No language interpreter was used.    Lauren Hayden is a 26 y.o. female who is currently pregnant who presents to the Emergency Department complaining of 2 days of a gradual onset, gradually worsening, constant rash described as itchy bumps on all extremities. She reports that her daughter has been experiencing the same symptoms since yesterday and she states that they just recently moved into a new apartment. She denies fever, emesis and diarrhea as associated symptoms. She has a h/o depression and is a former smoker but denies alcohol use.  Past Medical History  Diagnosis Date  . Depression     Past Surgical History  Procedure Date  . Dilation and curettage of uterus     Family History  Problem Relation Age of Onset  . Anesthesia problems Neg Hx   . Hypotension Neg Hx   . Malignant hyperthermia Neg Hx   . Pseudochol deficiency Neg Hx   . Diabetes Mother   . Hypertension Mother   . Hypertension Father   . Asthma Sister     History  Substance Use Topics  . Smoking status: Former Smoker    Types: Cigars    Quit date: 08/23/2011  . Smokeless tobacco: Never Used  . Alcohol Use: No     occ    OB History    Grav Para Term Preterm Abortions TAB SAB Ect Mult Living   7 2 2  4 4    2       Review of Systems  Constitutional: Negative for fever and chills.  Gastrointestinal: Negative for vomiting and diarrhea.  Skin: Positive for rash.  All other systems reviewed and are negative.    Allergies  Review of patient's allergies indicates no known allergies.  Home Medications   Current Outpatient Rx  Name  Route Sig Dispense Refill  . ONE-DAILY MULTI VITAMINS PO TABS Oral Take 1 tablet by mouth daily.    Marland Kitchen PENICILLIN V POTASSIUM 500 MG PO TABS Oral Take 1 tablet (500 mg total) by mouth 4 (four) times daily. 28 tablet 0  . PRENATAL MULTIVITAMIN CH Oral Take 1 tablet by mouth daily. 30 tablet 0  . ZOLPIDEM TARTRATE 5 MG PO TABS Oral Take 1 tablet (5 mg total) by mouth at bedtime as needed for sleep. 3 tablet 0    Triage Vitals: BP 120/79  Pulse 93  Temp 98.2 F (36.8 C) (Oral)  Resp 16  Wt 215 lb 6.4 oz (97.705 kg)  SpO2 100%  LMP 07/22/2011  Physical Exam  Constitutional: She is oriented to person, place, and time. She appears well-developed and well-nourished.  HENT:  Head: Normocephalic.  Right Ear: External ear normal.  Left Ear: External ear normal.  Nose: Nose normal.  Mouth/Throat: Oropharynx is clear and moist.  Eyes: EOM are normal. Pupils are equal, round, and reactive to light. Right eye exhibits no discharge. Left eye exhibits no discharge.  Neck: Normal range of motion. Neck supple. No tracheal deviation present.       No nuchal rigidity no meningeal signs  Cardiovascular: Normal rate and  regular rhythm.   Pulmonary/Chest: Effort normal and breath sounds normal. No stridor. No respiratory distress. She has no wheezes. She has no rales.  Abdominal: Soft. She exhibits no distension and no mass. There is no tenderness. There is no rebound and no guarding.  Musculoskeletal: Normal range of motion. She exhibits no edema and no tenderness.  Neurological: She is alert and oriented to person, place, and time. She has normal reflexes. No cranial nerve deficit. Coordination normal.  Skin: Skin is warm. No rash noted. She is not diaphoretic. No erythema. No pallor.       No pettechia no purpura, multiple insect bites to arms and legs    ED Course  Procedures (including critical care time)  DIAGNOSTIC STUDIES: Oxygen Saturation is 100% on room air, normal by my interpretation.      COORDINATION OF CARE: 11:20PM-Discussed discharge plan which includes following up with OB GYN on whether she can take benadryl with pt at bedside and pt agreed to plan.   Labs Reviewed - No data to display No results found.   1. Flea bite of multiple sites       MDM  I personally performed the services described in this documentation, which was scribed in my presence. The recorded information has been reviewed and considered.   Patient with what appears to be flea bites on exam. None of the areas appear with spreading erythema induration fluctuance or tenderness and no history of fever to suggest superinfection I will discharge home with supportive care family updated and agrees with plan   Arley Phenix, MD 03/08/12 2349

## 2012-03-08 NOTE — ED Notes (Signed)
Mom reports insect bites starting 3 days ago, spreading and getting worse. Daughter also has them.

## 2012-03-09 ENCOUNTER — Telehealth: Payer: Self-pay | Admitting: General Practice

## 2012-03-09 ENCOUNTER — Ambulatory Visit (INDEPENDENT_AMBULATORY_CARE_PROVIDER_SITE_OTHER): Payer: Medicaid Other | Admitting: Obstetrics & Gynecology

## 2012-03-09 VITALS — BP 121/79 | Temp 98.0°F | Wt 211.6 lb

## 2012-03-09 DIAGNOSIS — Z8619 Personal history of other infectious and parasitic diseases: Secondary | ICD-10-CM

## 2012-03-09 DIAGNOSIS — O09899 Supervision of other high risk pregnancies, unspecified trimester: Secondary | ICD-10-CM

## 2012-03-09 DIAGNOSIS — Z3480 Encounter for supervision of other normal pregnancy, unspecified trimester: Secondary | ICD-10-CM

## 2012-03-09 LAB — POCT URINALYSIS DIP (DEVICE)
Glucose, UA: NEGATIVE mg/dL
Hgb urine dipstick: NEGATIVE
Nitrite: NEGATIVE
Urobilinogen, UA: 0.2 mg/dL (ref 0.0–1.0)
pH: 7 (ref 5.0–8.0)

## 2012-03-09 MED ORDER — FLUCONAZOLE 150 MG PO TABS: 150.0000 mg | ORAL_TABLET | Freq: Once | ORAL | Status: DC

## 2012-03-09 NOTE — Telephone Encounter (Signed)
Called pt and she stated that she has very bad vaginal irritation. She has been rubbing and scratching the area and cannot wait until next appt on 03/11/12. I asked if pt can come to clinic now. She stated she can be here in 20 min.

## 2012-03-09 NOTE — Progress Notes (Signed)
Vaginal irritation, exam c/w yeast. Rx diflucan with refill. benadryl for itching associated with bites

## 2012-03-09 NOTE — Addendum Note (Signed)
Addended by: Adam Phenix on: 03/09/2012 12:24 PM   Modules accepted: Orders

## 2012-03-09 NOTE — Telephone Encounter (Signed)
Pt called stating she is calling about a medication she needs- also states she's having issues and needs this medication prescribed for her and would like a nurse to call her back.

## 2012-03-09 NOTE — Patient Instructions (Signed)
Vaginitis  Vaginitis in a soreness, swelling and redness (inflammation) of the vagina and vulva. This is not a sexually transmitted infection.   CAUSES   Yeast vaginitis is caused by yeast (candida) that is normally found in your vagina. With a yeast infection, the candida has over grown in number to a point that upsets the chemical balance.  SYMPTOMS    White thick vaginal discharge.   Swelling, itching, redness and irritation of the vagina and possibly the lips of the vagina (vulva).   Burning or painful urination.   Painful intercourse.  HOME CARE INSTRUCTIONS    Finish all medication as prescribed.   Do not have sex until treatment is completed or instructed by your healthcare giver.   Take warm sitz baths.   Do not douche.   Do not use tampons, especially scented ones.   Wear cotton underwear.   Avoid tight pants and panty hose.   Tell your sexual partner that you have a yeast infection. They should go to their caregiver if they have symptoms such as mild rash or itching.   Your sexual partner should be treated if your infection is difficult to eliminate.   Practice safer sex. Use condoms.   Some vaginal medications cause latex condoms to fail. Ask your caregiver this.  SEEK MEDICAL CARE IF:    You develop a fever.   The infection is getting worse after 2 days of treatment.   The infection is not getting better after 3 days of treatment.   You develop blisters in or around your vagina.   You develop vaginal bleeding, and it is not your menstrual period.   You have pain when you urinate.   You develop intestinal problems.   You have pain with sexual intercourse.  Document Released: 07/11/2004 Document Revised: 05/23/2011 Document Reviewed: 02/16/2009  ExitCare Patient Information 2012 [employer].

## 2012-03-09 NOTE — Progress Notes (Signed)
Pulse: 102 Has swelling and irritation in vaginal area, also having white/yellow discharge. Pt went to [facility] ER yesterday, has flea bites.

## 2012-03-10 DIAGNOSIS — Z3480 Encounter for supervision of other normal pregnancy, unspecified trimester: Secondary | ICD-10-CM

## 2012-03-10 LAB — WET PREP, GENITAL
Clue Cells Wet Prep HPF POC: NONE SEEN
Trich, Wet Prep: NONE SEEN

## 2012-03-11 ENCOUNTER — Encounter: Payer: Self-pay | Admitting: Family Medicine

## 2012-03-17 ENCOUNTER — Encounter (HOSPITAL_COMMUNITY): Payer: Self-pay | Admitting: *Deleted

## 2012-03-17 ENCOUNTER — Inpatient Hospital Stay (HOSPITAL_COMMUNITY)
Admission: AD | Admit: 2012-03-17 | Discharge: 2012-03-17 | Disposition: A | Discharge status: Home / Self Care | Payer: Self-pay | Source: Ambulatory Visit | Attending: Obstetrics & Gynecology | Admitting: Obstetrics & Gynecology

## 2012-03-17 DIAGNOSIS — Z3480 Encounter for supervision of other normal pregnancy, unspecified trimester: Secondary | ICD-10-CM

## 2012-03-17 DIAGNOSIS — R52 Pain, unspecified: Secondary | ICD-10-CM

## 2012-03-17 DIAGNOSIS — O99891 Other specified diseases and conditions complicating pregnancy: Secondary | ICD-10-CM | POA: Insufficient documentation

## 2012-03-17 LAB — URINALYSIS, ROUTINE W REFLEX MICROSCOPIC
Bilirubin Urine: NEGATIVE
Nitrite: NEGATIVE
Protein, ur: NEGATIVE mg/dL
Specific Gravity, Urine: 1.015 (ref 1.005–1.030)
Urobilinogen, UA: 0.2 mg/dL (ref 0.0–1.0)

## 2012-03-17 LAB — URINE MICROSCOPIC-ADD ON

## 2012-03-17 MED ORDER — ACETAMINOPHEN 325 MG PO TABS: 650.0000 mg | ORAL_TABLET | Freq: Four times a day (QID) | ORAL | Status: DC | PRN

## 2012-03-17 MED ORDER — ACETAMINOPHEN 500 MG PO TABS
1000.0000 mg | ORAL_TABLET | ORAL | Status: AC
  Administered 2012-03-17: 1000 mg via ORAL
  Filled 2012-03-17: qty 2

## 2012-03-17 NOTE — MAU Provider Note (Signed)
  History     CSN: [id number]  Arrival date and time: 03/17/12 0104   None     Body, legs, back aching  HPI 26yoF G7P2042@[redacted]w[redacted]d  complains of three day history of generalized body aches, getting worse today.  Denies vomiting today, but has vomited irregularly over past week.  Denies abdominal pain other than a contraction 2 or 3 times today, previous to ER.  Denies vaginal bleeding or leakage.  Denies dysuria.  Complains of bilateral leg aching, improved with hot water.  Admits to generalized lower extremity swelling over past two months.  Able to tolerate po solids and liquids, but appetite is decreased.  Had heard a friend was in labor and was worried that she might also be in labor, wanted to come in and "get something for pain".  Attends the clinic here at [facility] for her prenatal care.  OB History    Grav Para Term Preterm Abortions TAB SAB Ect Mult Living   7 2 2  4 4    2        Past Medical History  Diagnosis Date  . Depression     Past Surgical History  Procedure Date  . Dilation and curettage of uterus     Family History  Problem Relation Age of Onset  . Anesthesia problems Neg Hx   . Hypotension Neg Hx   . Malignant hyperthermia Neg Hx   . Pseudochol deficiency Neg Hx   . Diabetes Mother   . Hypertension Mother   . Hypertension Father   . Asthma Sister     History  Substance Use Topics  . Smoking status: Former Smoker    Types: Cigars    Quit date: 08/23/2011  . Smokeless tobacco: Never Used  . Alcohol Use: No     occ    Allergies: No Known Allergies  Prescriptions prior to admission  Medication Sig Dispense Refill  . Prenatal Vit-Fe Fumarate-FA (PRENATAL MULTIVITAMIN) TABS Take 1 tablet by mouth daily.  30 tablet  0  . fluconazole (DIFLUCAN) 150 MG tablet Take 1 tablet (150 mg total) by mouth once.  1 tablet  1  . Multiple Vitamin (MULTIVITAMIN) tablet Take 1 tablet by mouth daily.      . penicillin v potassium (VEETID) 500 MG tablet Take 1  tablet (500 mg total) by mouth 4 (four) times daily.  28 tablet  0  . zolpidem (AMBIEN) 5 MG tablet Take 1 tablet (5 mg total) by mouth at bedtime as needed for sleep.  3 tablet  0    ROS Gen: Denies fever CV: Denies CP Pulm: Denies SOB Abd: Denies Abdominal pain other than contractions.  Denies diarrhea. GU: Denies difficulty urinating  Physical Exam   Blood pressure 110/68, pulse 89, temperature 98.5 F (36.9 C), temperature source Oral, resp. rate 20, height 5\' 3"  (1.6 m), weight 96.616 kg (213 lb), last menstrual period 07/22/2011.  Physical Exam Gen: AAOx3, NAD CV: RRR, no MRG, pulses intact b/l Pulm: CTA Abd: Soft, NT/ND, +BS FHT: Baseline 145, accelerations, no decels,   MAU Course  Procedures    Assessment and Plan  26yoF G7P2042@[redacted]w[redacted]d  presents with generalized body aches  #body aches - patient assured these aches were a normal part of pregnancy - advised she could take Tylenol OTC for pain, not to exceed 3000mg  per day  #Dispo - discharge to home, follow up 10/9 at [facility] clinic (already scheduled)  Sonia Side 03/17/2012, 2:24 AM

## 2012-03-17 NOTE — MAU Note (Signed)
PT SAYS SHE STARTED HURTING  ALL DAY. -  WENT TO CLINIC  LAST 03-08-2012.  NEXT APPOINTMENT  IS 03-25-2012.      DENIES HSV AND MRSA.

## 2012-03-17 NOTE — MAU Note (Signed)
SAYS HAS BODY PAIN - STARTED AT 10PM- AFTER SHE ATE -  IT BECAME WORSE

## 2012-03-19 ENCOUNTER — Telehealth: Payer: Self-pay | Admitting: *Deleted

## 2012-03-19 NOTE — Telephone Encounter (Signed)
Talked with patient for 20 minutes. She was concerned about being uncomfortable. She has back pain and irregular contractions. She denies bleeding or vaginal discharge. She reports that the baby is moving a lot. I advised that she can take tylenol prn for pain. If she develops any regular contractions, bleeding, water leaking, decreased fetal movement or pain is worsening she should go back to MAU. Pt agrees with plan and said she feels better after just talking to someone. I told her that she can call us anytime she needs advice.

## 2012-03-19 NOTE — Telephone Encounter (Signed)
Patient left a message requesting a call back from a nurse about some problems that she is having.

## 2012-03-23 ENCOUNTER — Encounter (HOSPITAL_COMMUNITY): Payer: Self-pay | Admitting: *Deleted

## 2012-03-23 ENCOUNTER — Inpatient Hospital Stay (HOSPITAL_COMMUNITY)
Admission: AD | Admit: 2012-03-23 | Discharge: 2012-03-23 | Disposition: A | Discharge status: Home / Self Care | Payer: Self-pay | Source: Ambulatory Visit | Attending: Obstetrics & Gynecology | Admitting: Obstetrics & Gynecology

## 2012-03-23 ENCOUNTER — Encounter: Payer: Self-pay | Admitting: Family Medicine

## 2012-03-23 DIAGNOSIS — O479 False labor, unspecified: Secondary | ICD-10-CM

## 2012-03-23 DIAGNOSIS — O4700 False labor before 37 completed weeks of gestation, unspecified trimester: Secondary | ICD-10-CM | POA: Insufficient documentation

## 2012-03-23 LAB — URINALYSIS, ROUTINE W REFLEX MICROSCOPIC
Glucose, UA: NEGATIVE mg/dL
Protein, ur: NEGATIVE mg/dL
Urobilinogen, UA: 0.2 mg/dL (ref 0.0–1.0)

## 2012-03-23 LAB — URINE MICROSCOPIC-ADD ON

## 2012-03-23 NOTE — MAU Provider Note (Signed)
History     CSN: [id number]  Arrival date and time: 03/23/12 1332   First Provider Initiated Contact with Patient 03/23/12 1416      Chief Complaint  Patient presents with  . Contractions   HPI  Lauren Hayden 26 y.o. [id number] [redacted]w[redacted]d  Patient present today with complaint of "contractions" every 5 minutes that began at 12pm today and have since eased off. She reports the pain being in her lower pelvis and lower back. She has had no vaginal bleeding or gush of fluid. She continues to feel the baby moving.   OB History    Grav Para Term Preterm Abortions TAB SAB Ect Mult Living   7 2 2  4 4    2       Past Medical History  Diagnosis Date  . Depression     Past Surgical History  Procedure Date  . Dilation and curettage of uterus     Family History  Problem Relation Age of Onset  . Anesthesia problems Neg Hx   . Hypotension Neg Hx   . Malignant hyperthermia Neg Hx   . Pseudochol deficiency Neg Hx   . Diabetes Mother   . Hypertension Mother   . Hypertension Father   . Asthma Sister     History  Substance Use Topics  . Smoking status: Former Smoker    Types: Cigars    Quit date: 08/23/2011  . Smokeless tobacco: Never Used  . Alcohol Use: No     occ    Allergies: No Known Allergies  Prescriptions prior to admission  Medication Sig Dispense Refill  . acetaminophen (TYLENOL) 325 MG tablet Take 650 mg by mouth daily as needed. For pain      . calcium carbonate (TUMS - DOSED IN MG ELEMENTAL CALCIUM) 500 MG chewable tablet Chew 2 tablets by mouth 2 (two) times daily as needed. For heartburn      . prenatal vitamin w/FE, FA (NATACHEW) 29-1 MG CHEW Chew 1 tablet by mouth daily.        Review of Systems  Constitutional: Positive for fever (last night) and chills (last night). Negative for malaise/fatigue and diaphoresis.  Eyes: Negative for blurred vision.  Respiratory: Negative for shortness of breath.   Cardiovascular: Negative for chest pain.  Gastrointestinal:  Positive for abdominal pain (lower pelvic pain) and diarrhea (2 days ago). Negative for nausea, vomiting and constipation.  Genitourinary: Negative for dysuria, urgency and frequency.  Musculoskeletal: Positive for back pain (lower back pain).  Skin: Negative for rash.  Neurological: Negative for dizziness, loss of consciousness and headaches.   Physical Exam   Blood pressure 115/71, pulse 92, temperature 98.4 F (36.9 C), temperature source Oral, resp. rate 18, last menstrual period 07/22/2011.  Physical Exam  Nursing note and vitals reviewed. Constitutional: She is oriented to person, place, and time. She appears well-developed and well-nourished. No distress.  HENT:  Head: Normocephalic and atraumatic.  Mouth/Throat: Oropharynx is clear and moist.  Eyes: Conjunctivae normal and EOM are normal. Pupils are equal, round, and reactive to light. No scleral icterus.  Neck: Normal range of motion. Neck supple. No tracheal deviation present. No thyromegaly present.  Cardiovascular: Normal rate, regular rhythm, normal heart sounds and intact distal pulses.  Exam reveals no gallop and no friction rub.   No murmur heard. Respiratory: Effort normal and breath sounds normal. No respiratory distress. She has no wheezes. She has no rales.  GI: Soft. Bowel sounds are normal. She exhibits no distension. There is  no tenderness.  Musculoskeletal: Normal range of motion. She exhibits no edema and no tenderness.  Neurological: She is alert and oriented to person, place, and time.  Skin: Skin is warm and dry. No rash noted. She is not diaphoretic. No erythema.  Psychiatric: She has a normal mood and affect. Her behavior is normal. Thought content normal.    MAU Course  Procedures  Baby monitor: Baseline FHT about 130. Reactive. Moderate variability. No decelerations.   Dilation: Fingertip Effacement (%): Thick Station: Ballotable Exam by:: Bernita Buffy, CNM    Assessment and Plan   Patient is not  in active labor. She is most likely experiencing false labor contractions. She is stable and the fetal monitor is reassuring. No intervention is needed at this time.  Patient will be discharged home. Her next regular appointment is on Wednesday. She may reschedule this since she has been seen today.   Winfield Cunas 03/23/2012, 2:34 PM   Evaluation and management procedures were performed by PA-S under my supervision/collaboration. Chart reviewed, patient examined by me and I agree with management and plan. Danae Orleans, CNM 03/23/2012 5:52 PM

## 2012-03-23 NOTE — MAU Note (Signed)
C/O sudden onset of contractions and "baby balling up" around 1200. No leaking, bleeding or discharge. Feeling fetal movement today.

## 2012-03-24 NOTE — MAU Provider Note (Signed)
Medical Screening exam and patient care preformed by advanced practice provider.  Agree with the above management.  

## 2012-03-25 ENCOUNTER — Ambulatory Visit (INDEPENDENT_AMBULATORY_CARE_PROVIDER_SITE_OTHER): Payer: Self-pay | Admitting: Advanced Practice Midwife

## 2012-03-25 VITALS — BP 121/72 | Temp 97.2°F | Wt 214.0 lb

## 2012-03-25 DIAGNOSIS — O09899 Supervision of other high risk pregnancies, unspecified trimester: Secondary | ICD-10-CM

## 2012-03-25 DIAGNOSIS — K219 Gastro-esophageal reflux disease without esophagitis: Secondary | ICD-10-CM | POA: Insufficient documentation

## 2012-03-25 DIAGNOSIS — Z3480 Encounter for supervision of other normal pregnancy, unspecified trimester: Secondary | ICD-10-CM

## 2012-03-25 LAB — POCT URINALYSIS DIP (DEVICE)
Bilirubin Urine: NEGATIVE
Ketones, ur: 40 mg/dL — AB
Leukocytes, UA: NEGATIVE

## 2012-03-25 LAB — OB RESULTS CONSOLE GBS: GBS: POSITIVE

## 2012-03-25 MED ORDER — RANITIDINE HCL 150 MG PO TABS: 150.0000 mg | ORAL_TABLET | Freq: Two times a day (BID) | ORAL | Status: DC

## 2012-03-25 NOTE — Progress Notes (Signed)
Pulse 90.  Edema trace in both legs and feet. C/o pain on lower back, lower abdomen, and legs. Vaginal d/c as thick white; no odor, no itch.

## 2012-03-25 NOTE — Addendum Note (Signed)
Addended by: Franchot Mimes on: 03/25/2012 12:26 PM   Modules accepted: Orders

## 2012-03-25 NOTE — Progress Notes (Signed)
Doing well.  Good fetal movement, denies vaginal bleeding, LOF, regular contractions.  Some sharp intermittent pain in both sides of abdomen with movement. Daily heartburn keeping her from drinking enough water. Discussed round ligament pain, increase in PO fluids, Zantac 150 mg BID.

## 2012-03-26 LAB — GC/CHLAMYDIA PROBE AMP, GENITAL
Chlamydia, DNA Probe: NEGATIVE
GC Probe Amp, Genital: NEGATIVE

## 2012-03-26 NOTE — MAU Provider Note (Signed)
I have seen this patient and agree with the above resident's note.  LEFTWICH-KIRBY, Zemirah Krasinski Certified Nurse-Midwife 

## 2012-04-01 ENCOUNTER — Ambulatory Visit (INDEPENDENT_AMBULATORY_CARE_PROVIDER_SITE_OTHER): Payer: Self-pay | Admitting: Advanced Practice Midwife

## 2012-04-01 VITALS — BP 120/77 | Temp 97.0°F | Wt 213.0 lb

## 2012-04-01 DIAGNOSIS — Z23 Encounter for immunization: Secondary | ICD-10-CM

## 2012-04-01 DIAGNOSIS — Z8619 Personal history of other infectious and parasitic diseases: Secondary | ICD-10-CM

## 2012-04-01 DIAGNOSIS — O09899 Supervision of other high risk pregnancies, unspecified trimester: Secondary | ICD-10-CM

## 2012-04-01 DIAGNOSIS — Z3480 Encounter for supervision of other normal pregnancy, unspecified trimester: Secondary | ICD-10-CM

## 2012-04-01 LAB — POCT URINALYSIS DIP (DEVICE)
Glucose, UA: NEGATIVE mg/dL
Ketones, ur: 40 mg/dL — AB
Specific Gravity, Urine: >=1.03 (ref 1.005–1.030)

## 2012-04-01 MED ORDER — TETANUS-DIPHTH-ACELL PERTUSSIS 5-2.5-18.5 LF-MCG/0.5 IM SUSP
0.5000 mL | Freq: Once | INTRAMUSCULAR | Status: AC
  Administered 2012-04-01: 0.5 mL via INTRAMUSCULAR

## 2012-04-01 MED ORDER — INFLUENZA VIRUS VACC SPLIT PF IM SUSP
0.5000 mL | Freq: Once | INTRAMUSCULAR | Status: AC
  Administered 2012-04-01: 0.5 mL via INTRAMUSCULAR

## 2012-04-01 NOTE — Progress Notes (Signed)
P = 92 Mild edema in feet/legs Pain/pressure in lower abdomen

## 2012-04-01 NOTE — Progress Notes (Signed)
Doing well.  Good fetal movement, denies vaginal bleeding, LOF, regular contractions.  Reviewed GBS positive with pt. Questions answered.

## 2012-04-01 NOTE — Addendum Note (Signed)
Addended by: Sherre Lain A on: 04/01/2012 12:20 PM   Modules accepted: Orders

## 2012-04-06 ENCOUNTER — Inpatient Hospital Stay (HOSPITAL_COMMUNITY)
Admission: AD | Admit: 2012-04-06 | Discharge: 2012-04-06 | Disposition: A | Discharge status: Home / Self Care | Payer: Self-pay | Source: Ambulatory Visit | Attending: Obstetrics and Gynecology | Admitting: Obstetrics and Gynecology

## 2012-04-06 ENCOUNTER — Encounter (HOSPITAL_COMMUNITY): Payer: Self-pay | Admitting: *Deleted

## 2012-04-06 DIAGNOSIS — Z8619 Personal history of other infectious and parasitic diseases: Secondary | ICD-10-CM

## 2012-04-06 DIAGNOSIS — Z3480 Encounter for supervision of other normal pregnancy, unspecified trimester: Secondary | ICD-10-CM

## 2012-04-06 DIAGNOSIS — K219 Gastro-esophageal reflux disease without esophagitis: Secondary | ICD-10-CM

## 2012-04-06 DIAGNOSIS — O368190 Decreased fetal movements, unspecified trimester, not applicable or unspecified: Secondary | ICD-10-CM | POA: Insufficient documentation

## 2012-04-06 DIAGNOSIS — O479 False labor, unspecified: Secondary | ICD-10-CM | POA: Insufficient documentation

## 2012-04-06 DIAGNOSIS — N949 Unspecified condition associated with female genital organs and menstrual cycle: Secondary | ICD-10-CM | POA: Insufficient documentation

## 2012-04-06 LAB — POCT FERN TEST: Fern Test: NEGATIVE

## 2012-04-06 LAB — AMNISURE RUPTURE OF MEMBRANE (ROM) NOT AT ARMC: Amnisure ROM: NEGATIVE

## 2012-04-06 LAB — WET PREP, GENITAL
Clue Cells Wet Prep HPF POC: NONE SEEN
Trich, Wet Prep: NONE SEEN

## 2012-04-06 NOTE — MAU Provider Note (Signed)
History     CSN: [id number]  Arrival date and time: 04/06/12 1440   First Provider Initiated Contact with Patient 04/06/12 1606      Chief Complaint  Patient presents with  . Vaginal Discharge  . Labor Eval  . Decreased Fetal Movement   HPI  Patient reports 1 week history of fluid leakage after sneezing.  Leakage is enough volume to soak patient's undergarments.  Patient denies contractions and reports no blood with discharge.  Patient also reports decreased fetal movement only as of today.  OB History    Grav Para Term Preterm Abortions TAB SAB Ect Mult Living   7 2 2  4 4    2        Past Medical History  Diagnosis Date  . Depression     Past Surgical History  Procedure Date  . Dilation and curettage of uterus     Family History  Problem Relation Age of Onset  . Anesthesia problems Neg Hx   . Hypotension Neg Hx   . Malignant hyperthermia Neg Hx   . Pseudochol deficiency Neg Hx   . Diabetes Mother   . Hypertension Mother   . Hypertension Father   . Asthma Sister     History  Substance Use Topics  . Smoking status: Former Smoker    Types: Cigars    Quit date: 08/23/2011  . Smokeless tobacco: Never Used  . Alcohol Use: No     occ    Allergies: No Known Allergies  Prescriptions prior to admission  Medication Sig Dispense Refill  . acetaminophen (TYLENOL) 325 MG tablet Take 650 mg by mouth every 6 (six) hours as needed. For pain      . prenatal vitamin w/FE, FA (NATACHEW) 29-1 MG CHEW Chew 1 tablet by mouth every morning.       . ranitidine (ZANTAC) 150 MG tablet Take 1 tablet (150 mg total) by mouth 2 (two) times daily.  60 tablet  2    Review of Systems  Constitutional: Negative for fever and chills.  Gastrointestinal: Negative for nausea, vomiting, abdominal pain, diarrhea and constipation.  Genitourinary: Negative for dysuria and urgency.   Physical Exam   Blood pressure 111/78, pulse 83, temperature 98.3 F (36.8 C), temperature source  Oral, resp. rate 16, height 5\' 4"  (1.626 m), weight 95.981 kg (211 lb 9.6 oz), last menstrual period 07/22/2011, SpO2 100.00%.  Physical Exam  Nursing note and vitals reviewed. Constitutional: She is oriented to person, place, and time. She appears well-developed and well-nourished.  HENT:  Head: Normocephalic.  Genitourinary:        Cervix: 1/50/-2  Neurological: She is alert and oriented to person, place, and time.  Skin: Skin is warm and dry.    Dilation: 1 Effacement (%): 50 Cervical Position: Middle Station: -3 Presentation: Undeterminable Exam by:: Sarajane Marek, RNC   MAU Course  Procedures Results for orders placed during the hospital encounter of 04/06/12 (from the past 24 hour(s))  AMNISURE RUPTURE OF MEMBRANE (ROM)     Status: Normal   Collection Time   04/06/12  3:56 PM      Component Value Range   Amnisure ROM NEGATIVE    POCT FERN TEST     Status: Normal   Collection Time   04/06/12  4:07 PM      Component Value Range   Fern Test Negative    WET PREP, GENITAL     Status: Abnormal   Collection Time   04/06/12  4:42 PM      Component Value Range   Yeast Wet Prep HPF POC NONE SEEN  NONE SEEN   Trich, Wet Prep NONE SEEN  NONE SEEN   Clue Cells Wet Prep HPF POC NONE SEEN  NONE SEEN   WBC, Wet Prep HPF POC FEW (*) NONE SEEN     Assessment and Plan   26 yo female [phone], [redacted]w[redacted]d presenting for vaginal leakage, labor check, and decreased fetal movement  #Leakage -Amni sure negative for rupture of membranes -Wet prep negative  #Labor Check -Amni sure negative for rupture of membranes -No active contractions  #Decreased Fetal Movement -NST demonstrated accelerations reassuring for no apparent fetal distress  Discharge to home  Nira Conn 04/06/2012, 4:47 PM   I saw the patient with the PA  Student and agree with the above.

## 2012-04-06 NOTE — MAU Note (Signed)
Patient states she has been feeling wet with sneezing and in the mornings for about one week. No active leaking. States she is having abdominal and back pain since yesterday. Has not felt normal movement today.

## 2012-04-07 NOTE — MAU Provider Note (Signed)
Attestation of Attending Supervision of Advanced Practitioner (CNM/NP): Evaluation and management procedures were performed by the Advanced Practitioner under my supervision and collaboration.  I have reviewed the Advanced Practitioner's note and chart, and I agree with the management and plan.  Lauren Hayden 04/07/2012 5:38 AM

## 2012-04-08 ENCOUNTER — Ambulatory Visit (INDEPENDENT_AMBULATORY_CARE_PROVIDER_SITE_OTHER): Payer: Self-pay | Admitting: Advanced Practice Midwife

## 2012-04-08 DIAGNOSIS — O09899 Supervision of other high risk pregnancies, unspecified trimester: Secondary | ICD-10-CM

## 2012-04-08 LAB — POCT URINALYSIS DIP (DEVICE)
Ketones, ur: NEGATIVE mg/dL
Nitrite: NEGATIVE
Protein, ur: NEGATIVE mg/dL
pH: 6.5 (ref 5.0–8.0)

## 2012-04-08 NOTE — Progress Notes (Signed)
Pulse 83. No pain; pressure in vaginal area.

## 2012-04-08 NOTE — Progress Notes (Signed)
Feeling well. No C/o. Desires SVE today. "ready to be done"

## 2012-04-13 ENCOUNTER — Ambulatory Visit (INDEPENDENT_AMBULATORY_CARE_PROVIDER_SITE_OTHER): Payer: Self-pay | Admitting: Advanced Practice Midwife

## 2012-04-13 ENCOUNTER — Encounter: Payer: Self-pay | Admitting: Advanced Practice Midwife

## 2012-04-13 VITALS — BP 124/75 | Temp 97.3°F | Wt 215.0 lb

## 2012-04-13 DIAGNOSIS — O480 Post-term pregnancy: Secondary | ICD-10-CM

## 2012-04-13 DIAGNOSIS — O368190 Decreased fetal movements, unspecified trimester, not applicable or unspecified: Secondary | ICD-10-CM

## 2012-04-13 DIAGNOSIS — Z8619 Personal history of other infectious and parasitic diseases: Secondary | ICD-10-CM

## 2012-04-13 LAB — POCT URINALYSIS DIP (DEVICE)
Protein, ur: NEGATIVE mg/dL
Specific Gravity, Urine: 1.025 (ref 1.005–1.030)
Urobilinogen, UA: 0.2 mg/dL (ref 0.0–1.0)
pH: 6.5 (ref 5.0–8.0)

## 2012-04-13 NOTE — Patient Instructions (Signed)
Normal Labor and Delivery  Your caregiver must first be sure you are in labor. Signs of labor include:  · You may pass what is called "the mucus plug" before labor begins. This is a small amount of blood stained mucus.  · Regular uterine contractions.  · The time between contractions get closer together.  · The discomfort and pain gradually gets more intense.  · Pains are mostly located in the back.  · Pains get worse when walking.  · The cervix (the opening of the uterus becomes thinner (begins to efface) and opens up (dilates).  Once you are in labor and admitted into the hospital or care center, your caregiver will do the following:  · A complete physical examination.  · Check your vital signs (blood pressure, pulse, temperature and the fetal heart rate).  · Do a vaginal examination (using a sterile glove and lubricant) to determine:  · The position (presentation) of the baby (head [vertex] or buttock first).  · The level (station) of the baby's head in the birth canal.  · The effacement and dilatation of the cervix.  · You may have your pubic hair shaved and be given an enema depending on your caregiver and the circumstance.  · An electronic monitor is usually placed on your abdomen. The monitor follows the length and intensity of the contractions, as well as the baby's heart rate.  · Usually, your caregiver will insert an IV in your arm with a bottle of sugar water. This is done as a precaution so that medications can be given to you quickly during labor or delivery.  NORMAL LABOR AND DELIVERY IS DIVIDED UP INTO 3 STAGES:  First Stage  This is when regular contractions begin and the cervix begins to efface and dilate. This stage can last from 3 to 15 hours. The end of the first stage is when the cervix is 100% effaced and 10 centimeters dilated. Pain medications may be given by   · Injection (morphine, demerol, etc.)  · Regional anesthesia (spinal, caudal or epidural, anesthetics given in different locations of  the spine). Paracervical pain medication may be given, which is an injection of and anesthetic on each side of the cervix.  A pregnant woman may request to have "Natural Childbirth" which is not to have any medications or anesthesia during her labor and delivery.  Second Stage  This is when the baby comes down through the birth canal (vagina) and is born. This can take 1 to 4 hours. As the baby's head comes down through the birth canal, you may feel like you are going to have a bowel movement. You will get the urge to bear down and push until the baby is delivered. As the baby's head is being delivered, the caregiver will decide if an episiotomy (a cut in the perineum and vagina area) is needed to prevent tearing of the tissue in this area. The episiotomy is sewn up after the delivery of the baby and placenta. Sometimes a mask with nitrous oxide is given for the mother to breath during the delivery of the baby to help if there is too much pain. The end of Stage 2 is when the baby is fully delivered. Then when the umbilical cord stops pulsating it is clamped and cut.  Third Stage  The third stage begins after the baby is completely delivered and ends after the placenta (afterbirth) is delivered. This usually takes 5 to 30 minutes. After the placenta is delivered, a medication   is given either by intravenous or injection to help contract the uterus and prevent bleeding. The third stage is not painful and pain medication is usually not necessary. If an episiotomy was done, it is repaired at this time.  After the delivery, the mother is watched and monitored closely for 1 to 2 hours to make sure there is no postpartum bleeding (hemorrhage). If there is a lot of bleeding, medication is given to contract the uterus and stop the bleeding.  Document Released: 03/12/2008 Document Revised: 08/26/2011 Document Reviewed: 03/12/2008  Exit[employer]® Patient Information ©2013 [employer].

## 2012-04-13 NOTE — Progress Notes (Signed)
Pulse 96. Edema trace in feet. Vaginal d/c as thin white; no odor, no itch. States labia was swollen with pain last week.
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
## 2012-04-14 ENCOUNTER — Inpatient Hospital Stay (HOSPITAL_COMMUNITY)
Admission: AD | Admit: 2012-04-14 | Discharge: 2012-04-14 | Disposition: A | Discharge status: Home / Self Care | Payer: Self-pay | Source: Ambulatory Visit | Attending: Obstetrics & Gynecology | Admitting: Obstetrics & Gynecology

## 2012-04-14 ENCOUNTER — Encounter (HOSPITAL_COMMUNITY): Payer: Self-pay

## 2012-04-14 DIAGNOSIS — O26899 Other specified pregnancy related conditions, unspecified trimester: Secondary | ICD-10-CM

## 2012-04-14 DIAGNOSIS — Z3490 Encounter for supervision of normal pregnancy, unspecified, unspecified trimester: Secondary | ICD-10-CM

## 2012-04-14 DIAGNOSIS — R319 Hematuria, unspecified: Secondary | ICD-10-CM

## 2012-04-14 DIAGNOSIS — N949 Unspecified condition associated with female genital organs and menstrual cycle: Secondary | ICD-10-CM | POA: Insufficient documentation

## 2012-04-14 DIAGNOSIS — O99891 Other specified diseases and conditions complicating pregnancy: Secondary | ICD-10-CM | POA: Insufficient documentation

## 2012-04-14 DIAGNOSIS — N898 Other specified noninflammatory disorders of vagina: Secondary | ICD-10-CM

## 2012-04-14 LAB — WET PREP, GENITAL
Clue Cells Wet Prep HPF POC: NONE SEEN
Trich, Wet Prep: NONE SEEN
Yeast Wet Prep HPF POC: NONE SEEN

## 2012-04-14 LAB — URINALYSIS, ROUTINE W REFLEX MICROSCOPIC
Bilirubin Urine: NEGATIVE
Glucose, UA: NEGATIVE mg/dL
Ketones, ur: 15 mg/dL — AB
Protein, ur: NEGATIVE mg/dL

## 2012-04-14 LAB — URINE MICROSCOPIC-ADD ON

## 2012-04-14 NOTE — MAU Provider Note (Signed)
History     CSN: [id number]  Arrival date and time: 04/14/12 4098   First Provider Initiated Contact with Patient 04/14/12 0902      Chief Complaint  Patient presents with  . Vaginal irritation    HPI Lauren Hayden is a 26 y.o. female [id number] @[redacted]w[redacted]d  who presents today for "vaginal irritation." Lauren Hayden states that she had her membranes swept yesterday morning and has felt vaginal pressure, itching, and discomfort since that time. She has noticed vaginal "wetness" but does not know what type of fluid it is. Feels a sense of "heaviness" or "pressure" in her vagina, along with the irritated, itchy feeling. Onset was after membranes swept yesterday. Denies any vaginal bleeding. Has been noticing occasional, irregular contractions over the past 1-2 weeks. States that the contractions are mild, lasting <110min, and occur once or more per hour. Pt reports lower abdominal cramping and inguinal pain, especially during a contraction. Feels more cramping and pain since membranes swept yesterday. Has felt normal fetal movement within the last hour.   Past Medical History  Diagnosis Date  . Depression     Past Surgical History  Procedure Date  . Dilation and curettage of uterus     Family History  Problem Relation Age of Onset  . Anesthesia problems Neg Hx   . Hypotension Neg Hx   . Malignant hyperthermia Neg Hx   . Pseudochol deficiency Neg Hx   . Diabetes Mother   . Hypertension Mother   . Hypertension Father   . Asthma Sister     History  Substance Use Topics  . Smoking status: Former Smoker    Types: Cigars    Quit date: 08/23/2011  . Smokeless tobacco: Never Used  . Alcohol Use: No     occ    Allergies: No Known Allergies  Prescriptions prior to admission  Medication Sig Dispense Refill  . acetaminophen (TYLENOL) 325 MG tablet Take 650 mg by mouth every 6 (six) hours as needed. For pain      . prenatal vitamin w/FE, FA (NATACHEW) 29-1 MG CHEW Chew 1 tablet by mouth every  morning.       . ranitidine (ZANTAC) 150 MG tablet Take 1 tablet (150 mg total) by mouth 2 (two) times daily.  60 tablet  2    Review of Systems  Constitutional: Negative for fever and chills.  Eyes: Negative for blurred vision and double vision.  Respiratory: Negative for shortness of breath.   Cardiovascular: Negative for chest pain and palpitations.  Gastrointestinal: Positive for nausea and abdominal pain (cramping sensation of lower abdomen and pulling sensation in inguinal region bilaterally). Negative for diarrhea and constipation. Vomiting: patient reports nausea for the past several weeks of her pregnancy. This has resulted in occasional gagging or vomiting if she has eaten a large meal.  Genitourinary: Negative for dysuria, urgency and frequency.  Neurological: Positive for sensory change (heightened sense of smell, this is what typically leads to her nausea). Negative for dizziness.   Physical Exam   Blood pressure 119/70, pulse 93, temperature 97.4 F (36.3 C), temperature source Oral, resp. rate 18, height 5\' 3"  (1.6 m), weight 97.608 kg (215 lb 3 oz), last menstrual period 07/22/2011.  Physical Exam  Constitutional: She is oriented to person, place, and time. She appears well-developed and well-nourished. No distress.  HENT:  Head: Normocephalic and atraumatic.  Neck: Neck supple. No thyromegaly present.  Cardiovascular: Normal rate, regular rhythm and normal heart sounds.   No murmur heard. Respiratory:  Effort normal and breath sounds normal. No respiratory distress.  GI: Soft. There is Tenderness: mildly tender to palpation of lower abdomen/pelvis.. There is no rebound and no guarding.       Pregnant  Genitourinary: Pelvic exam was performed with patient supine. There is no rash, tenderness or lesion on the right labia. There is no rash, tenderness or lesion on the left labia. Enlarged: pregnant uterus. Cervix exhibits discharge (mucous; no clear fluid/liquid present).  Cervix exhibits no motion tenderness. No bleeding around the vagina. Vaginal discharge found.    Lymphadenopathy:    She has no cervical adenopathy.  Neurological: She is alert and oriented to person, place, and time.  Skin: Skin is warm and dry.  Psychiatric: She has a normal mood and affect. Her behavior is normal.    MAU Course  Procedures  Urinalysis dipstick with microscopic Wet prep  Results for orders placed during the hospital encounter of 04/14/12 (from the past 24 hour(s))  URINALYSIS, ROUTINE W REFLEX MICROSCOPIC     Status: Abnormal   Collection Time   04/14/12  8:22 AM      Component Value Range   Color, Urine YELLOW  YELLOW   APPearance CLEAR  CLEAR   Specific Gravity, Urine >1.030 (*) 1.005 - 1.030   pH 6.0  5.0 - 8.0   Glucose, UA NEGATIVE  NEGATIVE mg/dL   Hgb urine dipstick SMALL (*) NEGATIVE   Bilirubin Urine NEGATIVE  NEGATIVE   Ketones, ur 15 (*) NEGATIVE mg/dL   Protein, ur NEGATIVE  NEGATIVE mg/dL   Urobilinogen, UA 0.2  0.0 - 1.0 mg/dL   Nitrite NEGATIVE  NEGATIVE   Leukocytes, UA MODERATE (*) NEGATIVE  URINE MICROSCOPIC-ADD ON     Status: Abnormal   Collection Time   04/14/12  8:22 AM      Component Value Range   Squamous Epithelial / LPF MANY (*) RARE   WBC, UA 11-20  <3 WBC/hpf   RBC / HPF 3-6  <3 RBC/hpf   Bacteria, UA MANY (*) RARE   Urine-Other MUCOUS PRESENT    WET PREP, GENITAL     Status: Abnormal   Collection Time   04/14/12  9:30 AM      Component Value Range   Yeast Wet Prep HPF POC NONE SEEN  NONE SEEN   Trich, Wet Prep NONE SEEN  NONE SEEN   Clue Cells Wet Prep HPF POC NONE SEEN  NONE SEEN   WBC, Wet Prep HPF POC MODERATE (*) NONE SEEN     MDM Membranes swept yesterday, onset of symptoms afterwards. Pt denies bleeding, denies regular contractions. Has noticed some type of vaginal discharge but denies overt fluid leakage. Wet prep negative for yeast, trich, and clue cells. WBCs seen on wet prep expected. Mucous seen on pelvic  exam. Urinalysis contains many squamous epithelial cells + pt denies UTI symptoms, so not concerned for UTI. Pt is stable, doing well. Has follow-up appt at [facility].  Assessment and Plan  A: Term pregnancy. No rupture of membranes. Not in labor - negative wet prep, negative urinalysis, no rupture of membranes, + mucous seen on pelvic exam - membranes swept yesterday likely responsible for pt's symptoms  P: Discharge pt to home, she will follow-up at her previously scheduled appt at [facility] - no medications - pt is stable - pt educated on normal labor, normal response to sweeping of membranes, mucous plug - pt urged to return if worsening symptoms, if membranes rupture, and/or if experiences regular  contractions  Harriet Masson 04/14/2012, 10:24 AM   I was present for the exam and agree with above.  [place], [place] 04/14/2012 6:29 PM

## 2012-04-14 NOTE — MAU Note (Signed)
Patient had membranes stripped yesterday has had vaginal irritation with itching ever since then.

## 2012-04-14 NOTE — MAU Note (Signed)
Pt states membranes were stripped yesterday, was 2cm. Since then has had vaginal irritation and itching. Notes pressure with voiding.

## 2012-04-15 ENCOUNTER — Encounter: Payer: Self-pay | Admitting: Family Medicine

## 2012-04-16 LAB — URINE CULTURE: Special Requests: NORMAL

## 2012-04-20 ENCOUNTER — Inpatient Hospital Stay (HOSPITAL_COMMUNITY)
Admission: AD | Admit: 2012-04-20 | Discharge: 2012-04-20 | Disposition: A | Discharge status: Home / Self Care | Payer: Medicaid Other | Source: Ambulatory Visit | Attending: Obstetrics & Gynecology | Admitting: Obstetrics & Gynecology

## 2012-04-20 ENCOUNTER — Encounter (HOSPITAL_COMMUNITY): Payer: Self-pay | Admitting: *Deleted

## 2012-04-20 DIAGNOSIS — Z3480 Encounter for supervision of other normal pregnancy, unspecified trimester: Secondary | ICD-10-CM

## 2012-04-20 DIAGNOSIS — Z8619 Personal history of other infectious and parasitic diseases: Secondary | ICD-10-CM

## 2012-04-20 DIAGNOSIS — K219 Gastro-esophageal reflux disease without esophagitis: Secondary | ICD-10-CM

## 2012-04-20 DIAGNOSIS — F329 Major depressive disorder, single episode, unspecified: Secondary | ICD-10-CM | POA: Insufficient documentation

## 2012-04-20 DIAGNOSIS — F3289 Other specified depressive episodes: Secondary | ICD-10-CM | POA: Insufficient documentation

## 2012-04-20 DIAGNOSIS — O99340 Other mental disorders complicating pregnancy, unspecified trimester: Secondary | ICD-10-CM | POA: Insufficient documentation

## 2012-04-20 LAB — URINALYSIS, ROUTINE W REFLEX MICROSCOPIC
Nitrite: NEGATIVE
Specific Gravity, Urine: 1.025 (ref 1.005–1.030)
Urobilinogen, UA: 0.2 mg/dL (ref 0.0–1.0)
pH: 6.5 (ref 5.0–8.0)

## 2012-04-20 LAB — URINE MICROSCOPIC-ADD ON

## 2012-04-20 NOTE — MAU Note (Deleted)
C/o Nausea,headache, fever and malaise for past 3-4 days; received flu shot one month ago; postpartum 12 days -delivered vaginally; stopped breast feeding 3-4 days ago-but she is still pumping;   ;

## 2012-04-20 NOTE — MAU Provider Note (Signed)
  History     CSN: [id number]  Arrival date and time: 04/20/12 1332   None     Chief Complaint  Patient presents with  . Headache  . Nausea  . Fever   HPI This is a 26 y.o. female at [redacted]w[redacted]d who presents for labor eval. Is actually not having many contractions but wants to be induced. States is depressed and "just wants this baby out".  States was induced with other two and wants to be induced now. Denies suicidal or homicidal ideations. States is tired and irritable. Does seem interested in talking to a Child psychotherapist. Has an active Mental Health counselor.  OB History    Grav Para Term Preterm Abortions TAB SAB Ect Mult Living   7 2 2  4 4    2       Past Medical History  Diagnosis Date  . Depression     Past Surgical History  Procedure Date  . Dilation and curettage of uterus     Family History  Problem Relation Age of Onset  . Anesthesia problems Neg Hx   . Hypotension Neg Hx   . Malignant hyperthermia Neg Hx   . Pseudochol deficiency Neg Hx   . Diabetes Mother   . Hypertension Mother   . Hypertension Father   . Asthma Sister     History  Substance Use Topics  . Smoking status: Former Smoker    Types: Cigars    Quit date: 08/23/2011  . Smokeless tobacco: Never Used  . Alcohol Use: No     Comment: occ    Allergies: No Known Allergies  Prescriptions prior to admission  Medication Sig Dispense Refill  . Prenatal Vit-Fe Fumarate-FA (PRENATAL MULTIVITAMIN) TABS Take 1 tablet by mouth daily.      . ranitidine (ZANTAC) 150 MG tablet Take 1 tablet (150 mg total) by mouth 2 (two) times daily.  60 tablet  2    ROS See HPI  Physical Exam   Blood pressure 115/74, pulse 115, temperature 97.5 F (36.4 C), temperature source Oral, resp. rate 16, height 5\' 3"  (1.6 m), weight 215 lb (97.523 kg), last menstrual period 07/22/2011.  Physical Exam  Constitutional: She is oriented to person, place, and time. She appears well-developed and well-nourished. No distress.   Cardiovascular: Normal rate.   Respiratory: Effort normal.  GI: Soft.       FHR reactive Rare contractions  Genitourinary: Vagina normal and uterus normal. No vaginal discharge found.       Dilation: 2 Effacement (%): 60 Station: -3 Presentation: Vertex Exam by:: Artelia Laroche CNM   Musculoskeletal: Normal range of motion.  Neurological: She is alert and oriented to person, place, and time.  Skin: Skin is warm and dry.  Psychiatric: She has a normal mood and affect.    MAU Course  Procedures  MDM No change after an hour. Membranes swept.   Assessment and Plan  A:  SIUP at [redacted]w[redacted]d       Not in labor      Depression/Social Issues  P:  Discharge      Labor precautions       Seen by social work      Keep appt on Wed  [facility] 04/20/2012, 3:23 PM

## 2012-04-20 NOTE — MAU Note (Signed)
C/o abdominal pain since last night; tired of being pregnant;

## 2012-04-20 NOTE — Progress Notes (Signed)
CSW received consult and met with patient.  No barriers to discharge.  Full assessment documentation to follow.

## 2012-04-21 LAB — URINE CULTURE

## 2012-04-22 ENCOUNTER — Encounter: Payer: Self-pay | Admitting: Advanced Practice Midwife

## 2012-04-22 ENCOUNTER — Ambulatory Visit (INDEPENDENT_AMBULATORY_CARE_PROVIDER_SITE_OTHER): Payer: Medicaid Other | Admitting: Advanced Practice Midwife

## 2012-04-22 VITALS — BP 125/88 | Wt 213.2 lb

## 2012-04-22 DIAGNOSIS — O368190 Decreased fetal movements, unspecified trimester, not applicable or unspecified: Secondary | ICD-10-CM

## 2012-04-22 DIAGNOSIS — Z3490 Encounter for supervision of normal pregnancy, unspecified, unspecified trimester: Secondary | ICD-10-CM

## 2012-04-22 DIAGNOSIS — Z3480 Encounter for supervision of other normal pregnancy, unspecified trimester: Secondary | ICD-10-CM

## 2012-04-22 LAB — POCT URINALYSIS DIP (DEVICE)
Glucose, UA: NEGATIVE mg/dL
Ketones, ur: NEGATIVE mg/dL
Protein, ur: NEGATIVE mg/dL
Specific Gravity, Urine: 1.02 (ref 1.005–1.030)

## 2012-04-22 NOTE — Patient Instructions (Addendum)
Normal Labor and Delivery  Your caregiver must first be sure you are in labor. Signs of labor include:  · You may pass what is called "the mucus plug" before labor begins. This is a small amount of blood stained mucus.  · Regular uterine contractions.  · The time between contractions get closer together.  · The discomfort and pain gradually gets more intense.  · Pains are mostly located in the back.  · Pains get worse when walking.  · The cervix (the opening of the uterus becomes thinner (begins to efface) and opens up (dilates).  Once you are in labor and admitted into the hospital or care center, your caregiver will do the following:  · A complete physical examination.  · Check your vital signs (blood pressure, pulse, temperature and the fetal heart rate).  · Do a vaginal examination (using a sterile glove and lubricant) to determine:  · The position (presentation) of the baby (head [vertex] or buttock first).  · The level (station) of the baby's head in the birth canal.  · The effacement and dilatation of the cervix.  · You may have your pubic hair shaved and be given an enema depending on your caregiver and the circumstance.  · An electronic monitor is usually placed on your abdomen. The monitor follows the length and intensity of the contractions, as well as the baby's heart rate.  · Usually, your caregiver will insert an IV in your arm with a bottle of sugar water. This is done as a precaution so that medications can be given to you quickly during labor or delivery.  NORMAL LABOR AND DELIVERY IS DIVIDED UP INTO 3 STAGES:  First Stage  This is when regular contractions begin and the cervix begins to efface and dilate. This stage can last from 3 to 15 hours. The end of the first stage is when the cervix is 100% effaced and 10 centimeters dilated. Pain medications may be given by   · Injection (morphine, demerol, etc.)  · Regional anesthesia (spinal, caudal or epidural, anesthetics given in different locations of  the spine). Paracervical pain medication may be given, which is an injection of and anesthetic on each side of the cervix.  A pregnant woman may request to have "Natural Childbirth" which is not to have any medications or anesthesia during her labor and delivery.  Second Stage  This is when the baby comes down through the birth canal (vagina) and is born. This can take 1 to 4 hours. As the baby's head comes down through the birth canal, you may feel like you are going to have a bowel movement. You will get the urge to bear down and push until the baby is delivered. As the baby's head is being delivered, the caregiver will decide if an episiotomy (a cut in the perineum and vagina area) is needed to prevent tearing of the tissue in this area. The episiotomy is sewn up after the delivery of the baby and placenta. Sometimes a mask with nitrous oxide is given for the mother to breath during the delivery of the baby to help if there is too much pain. The end of Stage 2 is when the baby is fully delivered. Then when the umbilical cord stops pulsating it is clamped and cut.  Third Stage  The third stage begins after the baby is completely delivered and ends after the placenta (afterbirth) is delivered. This usually takes 5 to 30 minutes. After the placenta is delivered, a medication   is given either by intravenous or injection to help contract the uterus and prevent bleeding. The third stage is not painful and pain medication is usually not necessary. If an episiotomy was done, it is repaired at this time.  After the delivery, the mother is watched and monitored closely for 1 to 2 hours to make sure there is no postpartum bleeding (hemorrhage). If there is a lot of bleeding, medication is given to contract the uterus and stop the bleeding.  Document Released: 03/12/2008 Document Revised: 08/26/2011 Document Reviewed: 03/12/2008  [employer]® Patient Information ©2013 [employer].
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
## 2012-04-22 NOTE — Progress Notes (Signed)
Very frustrated baby has not come. Wants to be induced, states "I was induced with the other two, and I am tired of this.  Can't you induce me?"  Denies SI/HI but states she is depressed because she cannot be induced. Membranes swept last week, 2 days ago in MAU and today. States decreased FM > NST (reactive).  I told her we might consider IOL at 40.5 wks.

## 2012-04-22 NOTE — Progress Notes (Signed)
Pulse 95 Patient reports decreased FM- 1 movement per hour, hasn't felt movement since she's been up. Reports pelvic pain and pressure and occasional contractions/cramping. Also reports a small amount of bloody mucous.

## 2012-04-24 ENCOUNTER — Other Ambulatory Visit: Payer: Self-pay

## 2012-04-24 ENCOUNTER — Encounter (HOSPITAL_COMMUNITY): Payer: Self-pay

## 2012-04-24 ENCOUNTER — Inpatient Hospital Stay (HOSPITAL_COMMUNITY)
Admission: AD | Admit: 2012-04-24 | Discharge: 2012-04-24 | Disposition: A | Discharge status: Home / Self Care | Payer: Medicaid Other | Source: Ambulatory Visit | Attending: Obstetrics & Gynecology | Admitting: Obstetrics & Gynecology

## 2012-04-24 DIAGNOSIS — O479 False labor, unspecified: Secondary | ICD-10-CM | POA: Insufficient documentation

## 2012-04-24 MED ORDER — OXYCODONE-ACETAMINOPHEN 5-325 MG PO TABS
2.0000 | ORAL_TABLET | Freq: Once | ORAL | Status: AC
  Administered 2012-04-24: 2 via ORAL
  Filled 2012-04-24: qty 2

## 2012-04-24 MED ORDER — FLUCONAZOLE 150 MG PO TABS: 150.0000 mg | ORAL_TABLET | Freq: Once | ORAL | Status: DC

## 2012-04-24 NOTE — Telephone Encounter (Signed)
Pt called and stated that she is having severe itching and white, thick vaginal discharge.  I informed pt that we would order her diflucan a one time dose and they will evaluate her at her OB appt on 04/29/12 @ 1100am if symptoms persist.  Pt stated understanding and did not have any further questions.

## 2012-04-24 NOTE — MAU Provider Note (Signed)
Chief Complaint:  No chief complaint on file.   None     HPI: Lauren Hayden is a 26 y.o. [id number] at 52w4dwho presents to maternity admissions reporting painful contractions.  She reports having her membranes stripped at her clinic appointment this week and last week which caused contractions, but not as painful as now.  She indicates that her last 2 pregnancies she came in to the hospital with mild pain and was induced.  She wants to be induced today.  She reports good fetal movement, denies LOF, vaginal bleeding, vaginal itching/burning, urinary symptoms, h/a, dizziness, n/v, or fever/chills.     Past Medical History: Past Medical History  Diagnosis Date  . Depression     Past obstetric history: OB History    Grav Para Term Preterm Abortions TAB SAB Ect Mult Living   7 2 2  4 4    2      # Outc Date GA Lbr Len/2nd Wgt Sex Del Anes PTL Lv   1 TAB 2005           2 TAB 2006           3 TRM 2007 [redacted]w[redacted]d  6lb13oz(3.09kg) F SVD EPI     4 TRM 2008 [redacted]w[redacted]d  6lb10oz(3.005kg) F SVD EPI     5 TAB            6 TAB            7 CUR               Past Surgical History: Past Surgical History  Procedure Date  . Dilation and curettage of uterus     Family History: Family History  Problem Relation Age of Onset  . Anesthesia problems Neg Hx   . Hypotension Neg Hx   . Malignant hyperthermia Neg Hx   . Pseudochol deficiency Neg Hx   . Diabetes Mother   . Hypertension Mother   . Hypertension Father   . Asthma Sister     Social History: History  Substance Use Topics  . Smoking status: Former Smoker    Types: Cigars    Quit date: 08/23/2011  . Smokeless tobacco: Never Used  . Alcohol Use: No     Comment: occ    Allergies: No Known Allergies  Meds:  Prescriptions prior to admission  Medication Sig Dispense Refill  . Prenatal Vit-Fe Fumarate-FA (PRENATAL MULTIVITAMIN) TABS Take 1 tablet by mouth daily.      . ranitidine (ZANTAC) 150 MG tablet Take 1 tablet (150 mg total) by mouth 2  (two) times daily.  60 tablet  2  . fluconazole (DIFLUCAN) 150 MG tablet Take 1 tablet (150 mg total) by mouth once.  1 tablet  0    ROS: Pertinent findings in history of present illness.  Physical Exam  Blood pressure 125/77, pulse 110, temperature 99.8 F (37.7 C), temperature source Oral, resp. rate 18, height 5\' 3"  (1.6 m), weight 96.798 kg (213 lb 6.4 oz), last menstrual period 07/22/2011. GENERAL: Well-developed, well-nourished female in no acute distress.  HEENT: normocephalic HEART: normal rate RESP: normal effort ABDOMEN: Soft, non-tender, gravid appropriate for gestational age EXTREMITIES: Nontender, no edema NEURO: alert and oriented  Cervix checked by RN upon arrival: 2/60/-2  1 hour after first exam: Dilation: 3 Effacement (%): 70 Cervical Position: Middle Station: -2 Presentation: Vertex Exam by:: Sharen Counter CNM  FHT:  Baseline 145 , moderate variability, accelerations present, no decelerations Contractions: Occasional, palpate mild   Pt given  option of therapeutic rest and D/C home or stay and walk x1 hour for recheck.  Pt desires to stay for another hour.  Report to Philipp Deputy, CNM at 10pm  Sharen Counter Certified Nurse-Midwife 04/24/2012 9:49 PM  Cx reexamined after ambulating- unchanged (2+/60/-2) FHR 160 with accels; no decels; occ mi variables Ctx: none tracing; abd palpated when pt reported ctx -> soft; possibly reacting to discomfort from fetal movement?  Will d/c home after giving Percocet x 2 Rev'd s/s active labor and when to return F/U as sched at [facility] appt on 04/29/12  Cam Hai 04/24/2012 11:25 PM

## 2012-04-24 NOTE — MAU Note (Signed)
Patient c/o increased discomfort repositioned patient on her right side with pillow to her back.

## 2012-04-24 NOTE — MAU Note (Signed)
Patient back from walking placed on efm, plan of care discussed.

## 2012-04-24 NOTE — MAU Note (Signed)
Patient off efm to walk for one hour per Sharen Counter CNM

## 2012-04-25 ENCOUNTER — Inpatient Hospital Stay (HOSPITAL_COMMUNITY)
Admission: AD | Admit: 2012-04-25 | Discharge: 2012-04-27 | DRG: 775 | Disposition: A | Discharge status: Home / Self Care | Payer: Medicaid Other | Source: Ambulatory Visit | Attending: Obstetrics & Gynecology | Admitting: Obstetrics & Gynecology

## 2012-04-25 ENCOUNTER — Encounter (HOSPITAL_COMMUNITY): Payer: Self-pay | Admitting: Anesthesiology

## 2012-04-25 ENCOUNTER — Encounter (HOSPITAL_COMMUNITY): Payer: Self-pay | Admitting: *Deleted

## 2012-04-25 ENCOUNTER — Inpatient Hospital Stay (HOSPITAL_COMMUNITY): Payer: Medicaid Other | Admitting: Anesthesiology

## 2012-04-25 DIAGNOSIS — Z8619 Personal history of other infectious and parasitic diseases: Secondary | ICD-10-CM

## 2012-04-25 DIAGNOSIS — F329 Major depressive disorder, single episode, unspecified: Secondary | ICD-10-CM | POA: Diagnosis present

## 2012-04-25 DIAGNOSIS — IMO0002 Reserved for concepts with insufficient information to code with codable children: Secondary | ICD-10-CM

## 2012-04-25 DIAGNOSIS — G4700 Insomnia, unspecified: Secondary | ICD-10-CM

## 2012-04-25 DIAGNOSIS — Z3480 Encounter for supervision of other normal pregnancy, unspecified trimester: Secondary | ICD-10-CM

## 2012-04-25 DIAGNOSIS — O99892 Other specified diseases and conditions complicating childbirth: Secondary | ICD-10-CM | POA: Diagnosis present

## 2012-04-25 DIAGNOSIS — Z22330 Carrier of Group B streptococcus: Secondary | ICD-10-CM

## 2012-04-25 DIAGNOSIS — Z3490 Encounter for supervision of normal pregnancy, unspecified, unspecified trimester: Secondary | ICD-10-CM

## 2012-04-25 DIAGNOSIS — K219 Gastro-esophageal reflux disease without esophagitis: Secondary | ICD-10-CM

## 2012-04-25 DIAGNOSIS — O99344 Other mental disorders complicating childbirth: Secondary | ICD-10-CM

## 2012-04-25 DIAGNOSIS — O9989 Other specified diseases and conditions complicating pregnancy, childbirth and the puerperium: Secondary | ICD-10-CM

## 2012-04-25 DIAGNOSIS — F3289 Other specified depressive episodes: Secondary | ICD-10-CM | POA: Diagnosis present

## 2012-04-25 LAB — CBC
HCT: 29.8 % — ABNORMAL LOW (ref 36.0–46.0)
MCV: 72.7 fL — ABNORMAL LOW (ref 78.0–100.0)
RBC: 4.1 MIL/uL (ref 3.87–5.11)
RDW: 15.7 % — ABNORMAL HIGH (ref 11.5–15.5)
WBC: 18.3 10*3/uL — ABNORMAL HIGH (ref 4.0–10.5)

## 2012-04-25 LAB — TYPE AND SCREEN
ABO/RH(D): B POS
Antibody Screen: NEGATIVE

## 2012-04-25 MED ORDER — SENNOSIDES-DOCUSATE SODIUM 8.6-50 MG PO TABS
2.0000 | ORAL_TABLET | Freq: Every day | ORAL | Status: DC
  Administered 2012-04-26: 2 via ORAL

## 2012-04-25 MED ORDER — LIDOCAINE HCL (PF) 1 % IJ SOLN
INTRAMUSCULAR | Status: DC | PRN
  Administered 2012-04-25 (×2): 5 mL

## 2012-04-25 MED ORDER — SIMETHICONE 80 MG PO CHEW: 80.0000 mg | CHEWABLE_TABLET | ORAL | Status: DC | PRN

## 2012-04-25 MED ORDER — EPHEDRINE 5 MG/ML INJ: 10.0000 mg | INTRAVENOUS | Status: DC | PRN

## 2012-04-25 MED ORDER — DIPHENHYDRAMINE HCL 50 MG/ML IJ SOLN: 12.5000 mg | INTRAMUSCULAR | Status: DC | PRN

## 2012-04-25 MED ORDER — LACTATED RINGERS IV SOLN
500.0000 mL | INTRAVENOUS | Status: DC | PRN
  Administered 2012-04-25: 1000 mL via INTRAVENOUS

## 2012-04-25 MED ORDER — TERBUTALINE SULFATE 1 MG/ML IJ SOLN: 0.2500 mg | Freq: Once | INTRAMUSCULAR | Status: DC | PRN

## 2012-04-25 MED ORDER — EPHEDRINE 5 MG/ML INJ
10.0000 mg | INTRAVENOUS | Status: DC | PRN
  Filled 2012-04-25: qty 4

## 2012-04-25 MED ORDER — OXYTOCIN 40 UNITS IN LACTATED RINGERS INFUSION - SIMPLE MED
1.0000 m[IU]/min | INTRAVENOUS | Status: DC
  Administered 2012-04-25: 2 m[IU]/min via INTRAVENOUS
  Filled 2012-04-25: qty 1000

## 2012-04-25 MED ORDER — PHENYLEPHRINE 40 MCG/ML (10ML) SYRINGE FOR IV PUSH (FOR BLOOD PRESSURE SUPPORT)
80.0000 ug | PREFILLED_SYRINGE | INTRAVENOUS | Status: DC | PRN
  Filled 2012-04-25: qty 5

## 2012-04-25 MED ORDER — PENICILLIN G POTASSIUM 5000000 UNITS IJ SOLR
2.5000 10*6.[IU] | INTRAVENOUS | Status: DC
  Administered 2012-04-25 (×3): 2.5 10*6.[IU] via INTRAVENOUS
  Filled 2012-04-25 (×6): qty 2.5

## 2012-04-25 MED ORDER — PENICILLIN G POTASSIUM 5000000 UNITS IJ SOLR
5.0000 10*6.[IU] | Freq: Once | INTRAVENOUS | Status: AC
  Administered 2012-04-25: 5 10*6.[IU] via INTRAVENOUS
  Filled 2012-04-25: qty 5

## 2012-04-25 MED ORDER — DIBUCAINE 1 % RE OINT: 1.0000 "application " | TOPICAL_OINTMENT | RECTAL | Status: DC | PRN

## 2012-04-25 MED ORDER — LACTATED RINGERS IV SOLN
500.0000 mL | Freq: Once | INTRAVENOUS | Status: AC
  Administered 2012-04-25: 11:00:00 via INTRAVENOUS

## 2012-04-25 MED ORDER — PHENYLEPHRINE 40 MCG/ML (10ML) SYRINGE FOR IV PUSH (FOR BLOOD PRESSURE SUPPORT): 80.0000 ug | PREFILLED_SYRINGE | INTRAVENOUS | Status: DC | PRN

## 2012-04-25 MED ORDER — IBUPROFEN 600 MG PO TABS: 600.0000 mg | ORAL_TABLET | Freq: Four times a day (QID) | ORAL | Status: DC | PRN

## 2012-04-25 MED ORDER — OXYTOCIN 40 UNITS IN LACTATED RINGERS INFUSION - SIMPLE MED
62.5000 mL/h | INTRAVENOUS | Status: DC
  Administered 2012-04-25: 62.5 mL/h via INTRAVENOUS

## 2012-04-25 MED ORDER — BENZOCAINE-MENTHOL 20-0.5 % EX AERO
1.0000 "application " | INHALATION_SPRAY | CUTANEOUS | Status: DC | PRN
  Administered 2012-04-25: 1 via TOPICAL
  Filled 2012-04-25: qty 56

## 2012-04-25 MED ORDER — FENTANYL CITRATE 0.05 MG/ML IJ SOLN
100.0000 ug | INTRAMUSCULAR | Status: DC | PRN
  Administered 2012-04-25 (×5): 100 ug via INTRAVENOUS
  Filled 2012-04-25 (×5): qty 2

## 2012-04-25 MED ORDER — ONDANSETRON HCL 4 MG/2ML IJ SOLN: 4.0000 mg | INTRAMUSCULAR | Status: DC | PRN

## 2012-04-25 MED ORDER — PRENATAL MULTIVITAMIN CH
1.0000 | ORAL_TABLET | Freq: Every day | ORAL | Status: DC
  Administered 2012-04-26: 1 via ORAL
  Filled 2012-04-25: qty 1

## 2012-04-25 MED ORDER — TETANUS-DIPHTH-ACELL PERTUSSIS 5-2.5-18.5 LF-MCG/0.5 IM SUSP
0.5000 mL | Freq: Once | INTRAMUSCULAR | Status: AC
  Administered 2012-04-26: 0.5 mL via INTRAMUSCULAR
  Filled 2012-04-25: qty 0.5

## 2012-04-25 MED ORDER — LACTATED RINGERS IV SOLN
INTRAVENOUS | Status: DC
  Administered 2012-04-25 (×2): via INTRAVENOUS

## 2012-04-25 MED ORDER — CITRIC ACID-SODIUM CITRATE 334-500 MG/5ML PO SOLN: 30.0000 mL | ORAL | Status: DC | PRN

## 2012-04-25 MED ORDER — OXYTOCIN BOLUS FROM INFUSION: 500.0000 mL | INTRAVENOUS | Status: DC

## 2012-04-25 MED ORDER — OXYCODONE-ACETAMINOPHEN 5-325 MG PO TABS
1.0000 | ORAL_TABLET | ORAL | Status: DC | PRN
  Administered 2012-04-25 – 2012-04-26 (×3): 1 via ORAL
  Administered 2012-04-27: 2 via ORAL
  Filled 2012-04-25 (×2): qty 1
  Filled 2012-04-25: qty 2
  Filled 2012-04-25: qty 1
  Filled 2012-04-25: qty 2

## 2012-04-25 MED ORDER — OXYCODONE-ACETAMINOPHEN 5-325 MG PO TABS: 1.0000 | ORAL_TABLET | ORAL | Status: DC | PRN

## 2012-04-25 MED ORDER — ZOLPIDEM TARTRATE 5 MG PO TABS: 5.0000 mg | ORAL_TABLET | Freq: Every evening | ORAL | Status: DC | PRN

## 2012-04-25 MED ORDER — IBUPROFEN 600 MG PO TABS
600.0000 mg | ORAL_TABLET | Freq: Four times a day (QID) | ORAL | Status: DC
  Administered 2012-04-26 – 2012-04-27 (×6): 600 mg via ORAL
  Filled 2012-04-25 (×5): qty 1

## 2012-04-25 MED ORDER — LIDOCAINE HCL (PF) 1 % IJ SOLN
30.0000 mL | INTRAMUSCULAR | Status: DC | PRN
  Filled 2012-04-25: qty 30

## 2012-04-25 MED ORDER — ACETAMINOPHEN 325 MG PO TABS: 650.0000 mg | ORAL_TABLET | ORAL | Status: DC | PRN

## 2012-04-25 MED ORDER — LANOLIN HYDROUS EX OINT: TOPICAL_OINTMENT | CUTANEOUS | Status: DC | PRN

## 2012-04-25 MED ORDER — WITCH HAZEL-GLYCERIN EX PADS: 1.0000 "application " | MEDICATED_PAD | CUTANEOUS | Status: DC | PRN

## 2012-04-25 MED ORDER — ONDANSETRON HCL 4 MG PO TABS: 4.0000 mg | ORAL_TABLET | ORAL | Status: DC | PRN

## 2012-04-25 MED ORDER — DIPHENHYDRAMINE HCL 25 MG PO CAPS: 25.0000 mg | ORAL_CAPSULE | Freq: Four times a day (QID) | ORAL | Status: DC | PRN

## 2012-04-25 MED ORDER — FENTANYL 2.5 MCG/ML BUPIVACAINE 1/10 % EPIDURAL INFUSION (WH - ANES)
14.0000 mL/h | INTRAMUSCULAR | Status: DC
  Administered 2012-04-25: 14 mL/h via EPIDURAL
  Filled 2012-04-25: qty 125

## 2012-04-25 MED ORDER — ONDANSETRON HCL 4 MG/2ML IJ SOLN: 4.0000 mg | Freq: Four times a day (QID) | INTRAMUSCULAR | Status: DC | PRN

## 2012-04-25 NOTE — H&P (Signed)
Lauren Hayden is a 26 y.o. female presenting for eval of labor. Was d/c home a few hours ago with Percocet x 2 for comfort. Now with spotting; no leaking. +FM. At d/c her cx was 2/60 and she had been evaluated x 3 hours including ambulation. Her preg has been followed by the [facility] and has been remarkable for 1) depression (no meds currently) 2) GBS pos 3) plans BTL History OB History    Grav Para Term Preterm Abortions TAB SAB Ect Mult Living   7 2 2  4 4    2      Past Medical History  Diagnosis Date  . Depression    Past Surgical History  Procedure Date  . Dilation and curettage of uterus    Family History: family history includes Asthma in her sister; Diabetes in her mother; and Hypertension in her father and mother.  There is no history of Anesthesia problems, and Hypotension, and Malignant hyperthermia, and Pseudochol deficiency, . Social History:  reports that she quit smoking about 8 months ago. Her smoking use included Cigars. She has never used smokeless tobacco. She reports that she does not drink alcohol or use illicit drugs.   Prenatal Transfer Tool  Maternal Diabetes: No Genetic Screening: Normal Maternal Ultrasounds/Referrals: Normal Fetal Ultrasounds or other Referrals:  None Maternal Substance Abuse:  No Significant Maternal Medications:  None Significant Maternal Lab Results:  Lab values include: Group B Strep positive Other Comments:  None  ROS  Dilation: 3 Effacement (%): 70 Station: -2 Exam by:: Philipp Deputy CNM Blood pressure 141/83, pulse 101, temperature 99.8 F (37.7 C), temperature source Oral, resp. rate 20, last menstrual period 07/22/2011. Maternal Exam:  Uterine Assessment: Ctx q 3-4 mins; 40-50 sec  Cervix: cx now 3/70/-2; was 2/60 a few hours ago when d/c from labor eval  Fetal Exam Fetal Monitor Review: Baseline rate: 135.  Variability: moderate (6-25 bpm).   Pattern: accelerations present and no decelerations.    Fetal State Assessment: Category I -  tracings are normal.     Physical Exam  Constitutional: She is oriented to person, place, and time. She appears well-developed and well-nourished.  HENT:  Head: Normocephalic.  Neck: Normal range of motion.  Cardiovascular: Normal rate.   Respiratory: Effort normal.  Genitourinary: Vagina normal.  Musculoskeletal: Normal range of motion.  Neurological: She is alert and oriented to person, place, and time.  Skin: Skin is warm and dry.  Psychiatric: She has a normal mood and affect. Her behavior is normal. Thought content normal.    Prenatal labs: ABO, Rh: B/POS/-- (05/29 1006) Antibody: NEG (05/29 1006) Rubella: 296.1 (05/29 1006) RPR: NON REAC (05/29 1006)  HBsAg: NEGATIVE (05/29 1006)  HIV: NON REACTIVE (05/29 1006)  GBS:   Positive 03/25/12  Assessment/Plan: IUP at term Latent phase GBS pos  Admit to [facility] G for GBS positive IV pain meds/epidural prn Anticipate SVD   Lauren Hayden 04/25/2012, 1:48 AM

## 2012-04-25 NOTE — Progress Notes (Signed)
Patient ID: Lauren Hayden, female   DOB: March 20, 1986, 26 y.o.   MRN: [id number] Lauren Hayden is a 26 y.o. [id number] at [redacted]w[redacted]d admitted for SOOL  Subjective: Pain relief from Fentanyl. Not requesting epidural.  Objective: BP 144/80  Pulse 73  Temp 98.1 F (36.7 C) (Oral)  Resp 18  Ht 5\' 4"  (1.626 m)  Wt 96.616 kg (213 lb)  BMI 36.56 kg/m2  LMP 07/22/2011  Fetal Heart FHR: 140 bpm, variability: moderate,  accelerations:  Present,  decelerations:  Absent   Contractions: q69min  SVE:   Dilation: 6 Effacement (%): 90 Station: -1 Exam by:: Lauren Hayden, CNM AROM-> clear fluid Assessment / Plan:  Labor: active phase Fetal Wellbeing: Cat 1 Pain Control:  adequate Expected mode of delivery: NSVD  Lauren Hayden,Lauren Hayden 04/25/2012, 10:00 AM

## 2012-04-25 NOTE — Progress Notes (Signed)
Pt decided to not have BTL--pt asked several times if she was sure and  Pt continues to state that she does not wish to have tubial and that she desires other birthcontrol method

## 2012-04-25 NOTE — Progress Notes (Signed)
Patient ID: Lauren Hayden, female   DOB: 06/30/1985, 26 y.o.   MRN: [id number] Lauren Hayden is a 25 y.o. [id number] at [redacted]w[redacted]d admitted for labor   Subjective: Comfortable; has epidural  Objective: BP 103/57  Pulse 91  Temp 98.2 F (36.8 C) (Oral)  Resp 18  Ht 5\' 4"  (1.626 m)  Wt 96.616 kg (213 lb)  BMI 36.56 kg/m2  SpO2 100%  LMP 07/22/2011  Fetal Heart FHR: 130 bpm, variability: moderate,  accelerations:  Present,  decelerations:  Present mild variables   Contractions:   SVE:   Dilation: 7.5 Effacement (%): 100 Station: 0 Exam by:: J.Thornton, RN Clear AF Assessment / Plan:  Labor: Progressive active phase Fetal Wellbeing: reassuring fetal parameters Pain Control:  adequate Expected mode of delivery: NSVD  POE,DEIRDRE 04/25/2012, 2:41 PM

## 2012-04-25 NOTE — Progress Notes (Signed)
Lauren Hayden is a 26 y.o. [phone] at [redacted]w[redacted]d   Subjective: Breathing well through ctx; requesting second dose of Fentanyl  Objective: BP 125/72  Pulse 96  Temp 98.5 F (36.9 C) (Oral)  Resp 20  Ht 5\' 4"  (1.626 m)  Wt 96.616 kg (213 lb)  BMI 36.56 kg/m2  LMP 07/22/2011      FHT:  FHR: 140 bpm, variability: moderate,  accelerations:  Present,  decelerations:  Absent UC:   regular, every 2-3 minutes SVE:   Dilation: 6 Effacement (%): 80 Station: -2 Exam by:: C. Blackstock, RN- possible increase vag d/c, but membranes also palp  Labs: Lab Results  Component Value Date   WBC 18.3* 04/25/2012   HGB 9.2* 04/25/2012   HCT 29.8* 04/25/2012   MCV 72.7* 04/25/2012   PLT 228 04/25/2012    Assessment / Plan: IUP at term Active labor  Check for progress in 1-2 hrs or prn Anticipate SVD  Lauren Hayden 04/25/2012, 6:48 AM

## 2012-04-25 NOTE — Anesthesia Preprocedure Evaluation (Signed)
Anesthesia Evaluation  Patient identified by MRN, date of birth, ID band Patient awake    Reviewed: Allergy & Precautions, H&P , Patient's Chart, lab work & pertinent test results  Airway Mallampati: II TM Distance: >3 FB Neck ROM: full    Dental No notable dental hx.    Pulmonary neg pulmonary ROS,  breath sounds clear to auscultation  Pulmonary exam normal       Cardiovascular negative cardio ROS  Rhythm:regular Rate:Normal     Neuro/Psych PSYCHIATRIC DISORDERS negative neurological ROS  negative psych ROS   GI/Hepatic negative GI ROS, Neg liver ROS, GERD-  ,  Endo/Other  negative endocrine ROS  Renal/GU negative Renal ROS     Musculoskeletal   Abdominal   Peds  Hematology negative hematology ROS (+)   Anesthesia Other Findings   Reproductive/Obstetrics (+) Pregnancy                           Anesthesia Physical Anesthesia Plan  ASA: II  Anesthesia Plan: Epidural   Post-op Pain Management:    Induction:   Airway Management Planned:   Additional Equipment:   Intra-op Plan:   Post-operative Plan:   Informed Consent: I have reviewed the patients History and Physical, chart, labs and discussed the procedure including the risks, benefits and alternatives for the proposed anesthesia with the patient or authorized representative who has indicated his/her understanding and acceptance.     Plan Discussed with:   Anesthesia Plan Comments:         Anesthesia Quick Evaluation

## 2012-04-25 NOTE — Anesthesia Procedure Notes (Signed)
Epidural Patient location during procedure: OB Start time: 04/25/2012 11:21 AM  Staffing Anesthesiologist: Brayton Caves R Performed by: anesthesiologist   Preanesthetic Checklist Completed: patient identified, site marked, surgical consent, pre-op evaluation, timeout performed, IV checked, risks and benefits discussed and monitors and equipment checked  Epidural Patient position: sitting Prep: site prepped and draped and DuraPrep Patient monitoring: continuous pulse ox and blood pressure Approach: midline Injection technique: LOR air and LOR saline  Needle:  Needle type: Tuohy  Needle gauge: 17 G Needle length: 9 cm and 9 Needle insertion depth: 6 cm Catheter type: closed end flexible Catheter size: 19 Gauge Catheter at skin depth: 12 cm Test dose: negative  Assessment Events: blood not aspirated, injection not painful, no injection resistance, negative IV test and no paresthesia  Additional Notes Patient identified.  Risk benefits discussed including failed block, incomplete pain control, headache, nerve damage, paralysis, blood pressure changes, nausea, vomiting, reactions to medication both toxic or allergic, and postpartum back pain.  Patient expressed understanding and wished to proceed.  All questions were answered.  Sterile technique used throughout procedure and epidural site dressed with sterile barrier dressing. No paresthesia or other complications noted.The patient did not experience any signs of intravascular injection such as tinnitus or metallic taste in mouth nor signs of intrathecal spread such as rapid motor block. Please see nursing notes for vital signs.

## 2012-04-25 NOTE — Progress Notes (Signed)
Patient ID: Lauren Hayden, female   DOB: Sep 11, 1985, 26 y.o.   MRN: [id number] Lauren Hayden is a 26 y.o. [id number] at [redacted]w[redacted]d admitted for labor  Subjective: Comfortable now with epidural.  Objective: BP 115/59  Pulse 87  Temp 98.2 F (36.8 C) (Oral)  Resp 16  Ht 5\' 4"  (1.626 m)  Wt 96.616 kg (213 lb)  BMI 36.56 kg/m2  SpO2 100%  LMP 07/22/2011  Fetal Heart : 130 baseline, moderate variability, reactive   Contractions: q2-5  SVE:   Dilation: 5 Effacement (%): 90 Station: -1 Exam by:: K.Forsell,RNC No change per RN's exam  Assessment / Plan:  Labor: Active stage protracted-> will augment with pitocin Fetal Wellbeing: Cat 1 Pain Control:  adequate Expected mode of delivery: NSVD  Lauren Hayden 04/25/2012, 12:58 PM

## 2012-04-26 MED ORDER — COMPLETENATE 29-1 MG PO CHEW
1.0000 | CHEWABLE_TABLET | Freq: Every day | ORAL | Status: DC
  Administered 2012-04-26: 1 via ORAL
  Filled 2012-04-26 (×2): qty 1

## 2012-04-26 NOTE — Anesthesia Postprocedure Evaluation (Signed)
  Anesthesia Post-op Note  Patient: Lauren Hayden  Procedure(s) Performed: * No procedures listed *  Patient Location: Mother/Baby  Anesthesia Type:Epidural  Level of Consciousness: awake, alert  and oriented  Airway and Oxygen Therapy: Patient Spontanous Breathing  Post-op Pain: mild  Post-op Assessment: Patient's Cardiovascular Status Stable, Respiratory Function Stable, Patent Airway, No signs of Nausea or vomiting and Pain level controlled  Post-op Vital Signs: stable  Complications: No apparent anesthesia complications

## 2012-04-26 NOTE — Progress Notes (Signed)
Post Partum Day 1 Subjective: no complaints, up ad lib, voiding, tolerating PO and + flatus  Objective: Blood pressure 121/66, pulse 84, temperature 98 F (36.7 C), temperature source Oral, resp. rate 18, height 5\' 4"  (1.626 m), weight 96.616 kg (213 lb), last menstrual period 07/22/2011, SpO2 100.00%, unknown if currently breastfeeding.  Physical Exam:  General: alert, cooperative and no distress Lochia: appropriate Uterine Fundus: firm Incision: n/a DVT Evaluation: No evidence of DVT seen on physical exam. Negative Homan's sign. No cords or calf tenderness. No significant calf/ankle edema.   Basename 04/25/12 0150  HGB 9.2*  HCT 29.8*    Assessment/Plan: Plan for discharge tomorrow, Breastfeeding, Lactation consult and Contraception Mirena vs. Depo, OP circumcision   LOS: 1 day   Marge Duncans 04/26/2012, 6:39 AM

## 2012-04-27 ENCOUNTER — Encounter: Payer: Self-pay | Admitting: Obstetrics & Gynecology

## 2012-04-27 MED ORDER — WITCH HAZEL-GLYCERIN EX PADS: 1.0000 "application " | MEDICATED_PAD | CUTANEOUS | Status: DC | PRN

## 2012-04-27 MED ORDER — IBUPROFEN 600 MG PO TABS: 600.0000 mg | ORAL_TABLET | Freq: Four times a day (QID) | ORAL | Status: DC

## 2012-04-27 MED ORDER — BENZOCAINE-MENTHOL 20-0.5 % EX AERO: 1.0000 "application " | INHALATION_SPRAY | CUTANEOUS | Status: DC | PRN

## 2012-04-27 MED ORDER — OXYCODONE-ACETAMINOPHEN 5-325 MG PO TABS: 1.0000 | ORAL_TABLET | Freq: Four times a day (QID) | ORAL | Status: DC | PRN

## 2012-04-27 MED ORDER — DIBUCAINE 1 % RE OINT: 1.0000 "application " | TOPICAL_OINTMENT | RECTAL | Status: DC | PRN

## 2012-04-27 MED ORDER — LANOLIN HYDROUS EX OINT: 1.0000 "application " | TOPICAL_OINTMENT | CUTANEOUS | Status: DC | PRN

## 2012-04-27 NOTE — Progress Notes (Signed)
CSW met with MOB to check in now that she has delivered.  CSW initially met with MOB on 04/20/12 when she was a patient in [facility].  MOB states she is feeling so much better now that her son is here and she is no longer pregnant.  She states she feels like she can get back on track with her mental health treatment and states she is already feeling much happier.  She states no needs with arranging sessions with her counselor and states she has CSW's contact information if needed.  CSW notes no further interventions needed or barriers to discharge.

## 2012-04-27 NOTE — Discharge Summary (Signed)
Attestation of Attending Supervision of Advanced Practitioner (CNM/NP): Evaluation and management procedures were performed by the Advanced Practitioner under my supervision and collaboration.  I have reviewed the Advanced Practitioner's note and chart, and I agree with the management and plan.  HARRAWAY-SMITH, Disa Riedlinger 8:05 AM     

## 2012-04-27 NOTE — Progress Notes (Signed)
UR chart review completed.  

## 2012-04-27 NOTE — Discharge Summary (Signed)
Obstetric Discharge Summary Reason for Admission: onset of labor Prenatal Procedures: ultrasound Intrapartum Procedures: spontaneous vaginal delivery Postpartum Procedures: none Complications-Operative and Postpartum: none Hemoglobin  Date Value Range Status  04/25/2012 9.2* 12.0 - 15.0 g/dL Final     HCT  Date Value Range Status  04/25/2012 29.8* 36.0 - 46.0 % Final    Physical Exam:  General: alert and cooperative Lochia: appropriate Uterine Fundus: firm Incision: none DVT Evaluation: No evidence of DVT seen on physical exam. Negative Homan's sign. No cords or calf tenderness. BP 118/82  Pulse 71  Temp 97.8 F (36.6 C) (Oral)  Resp 18  Ht 5\' 4"  (1.626 m)  Wt 96.616 kg (213 lb)  BMI 36.56 kg/m2  SpO2 100%  LMP 07/22/2011  Breastfeeding? Unknown  Discharge Diagnoses: Term Pregnancy-delivered  Discharge Information: Date: 04/27/2012 Activity: pelvic rest  I examined pt and agree with documentation above and resident plan of care. [facility]  Diet: routine Medications: Ibuprofen and Percocet Condition: stable Instructions: AVS Discharge to: Home Follow-up Information    Follow up with [facility]. In 5 weeks. (Follow up in 5-6 weeks)    Contact information:   [address] [place] [address]          Newborn Data: Live born female  Birth Weight: 6 lb 11.4 oz (3045 g) APGAR: 9, 9 Out patient Circ  Home with mother.  Kuneff, Renee 04/27/2012, 7:30 AM

## 2012-04-28 NOTE — MAU Provider Note (Signed)
Medical Screening exam and patient care preformed by advanced practice provider.  Agree with the above management.  

## 2012-04-29 ENCOUNTER — Encounter: Payer: Medicaid Other | Admitting: Advanced Practice Midwife

## 2012-05-05 ENCOUNTER — Telehealth: Payer: Self-pay | Admitting: *Deleted

## 2012-05-05 NOTE — Telephone Encounter (Signed)
Pt called with request for Rx for pain. She states that she had vaginal delivery on 11/9 and received epidural during her labor.  She has been having back pain near the epidural site which is not relieved by ibuprofen 600mg  which she has been taking twice daily. She states that the Percocet was helping the pain, however she is all out of the medication. I explained to pt that she will require evaluation of her back pain prior to consideration for additional narcotic pain med. She will need to go to [facility] for this evaluation. Pt voiced understanding and stated she will try to get a baby-sitter today but it might not be until tomorrow that she can go to MAU. I advised pt to go as soon as she can and in the meantime. She should take the ibuprofen 600mg  every 6 hrs with food. Pt voiced understanding.

## 2012-05-25 ENCOUNTER — Ambulatory Visit: Payer: Medicaid Other | Admitting: Obstetrics and Gynecology

## 2012-05-27 ENCOUNTER — Encounter: Payer: Self-pay | Admitting: Obstetrics & Gynecology

## 2012-05-27 ENCOUNTER — Ambulatory Visit: Payer: Medicaid Other | Admitting: Obstetrics & Gynecology

## 2012-05-27 ENCOUNTER — Ambulatory Visit (INDEPENDENT_AMBULATORY_CARE_PROVIDER_SITE_OTHER): Payer: Self-pay | Admitting: Obstetrics & Gynecology

## 2012-05-27 VITALS — BP 119/72 | HR 78 | Temp 98.0°F | Resp 20 | Ht 63.0 in | Wt 191.6 lb

## 2012-05-27 DIAGNOSIS — O99345 Other mental disorders complicating the puerperium: Secondary | ICD-10-CM

## 2012-05-27 DIAGNOSIS — N898 Other specified noninflammatory disorders of vagina: Secondary | ICD-10-CM

## 2012-05-27 MED ORDER — CITALOPRAM HYDROBROMIDE 20 MG PO TABS: 20.0000 mg | ORAL_TABLET | Freq: Every day | ORAL | Status: DC

## 2012-05-27 NOTE — Progress Notes (Unsigned)
Pt reports having mucousy vaginal d/c w/odor.  She also reports that she has been trying to make appt @ [facility] for depression.

## 2012-05-27 NOTE — Progress Notes (Unsigned)
  Subjective:    Patient ID: Lauren Hayden, female    DOB: 28-Dec-1985, 26 y.o.   MRN: [id number]  HPI  She is a 26 yo S P2 who is here for her pp visit. She complains of back pain and is requesting narcotics. She has not had sex yet and is considering BTL. She is having symptoms c/w PP depression, anger, crying spells. She denies HI/SI. She already has a social work contact but hasn't called her yet. She is willing to start meds today. She reports a vaginal odor.  Review of Systems     Objective:   Physical Exam  Normal vaginal mucosa and discharge (I did obtain a wet prep) Normal vulva/perineum Normal involuted uterus and non-enlarged adnexa     Assessment & Plan:  Pp-stable Depression with difficulty falling asleep. I will e prescribe celexa 20 mg and re evaluate her in 3 weeks. Check TSH today.

## 2012-05-28 LAB — TSH: TSH: 0.894 u[IU]/mL (ref 0.350–4.500)

## 2012-06-04 ENCOUNTER — Telehealth: Payer: Self-pay

## 2012-06-04 MED ORDER — FLUCONAZOLE 150 MG PO TABS: 150.0000 mg | ORAL_TABLET | Freq: Once | ORAL | Status: DC

## 2012-06-04 MED ORDER — METRONIDAZOLE 500 MG PO TABS: 500.0000 mg | ORAL_TABLET | Freq: Two times a day (BID) | ORAL | Status: DC

## 2012-06-04 NOTE — Telephone Encounter (Signed)
Pt called for test results.  Called and informed pt that she was positive for BV.  I informed her that I would e-prescribe her flagyl.  Pt requested something for a yeast infection as well.  I informed her that I would order dicflucan.  Pt stated understanding and did not have any other questions.

## 2012-06-18 ENCOUNTER — Ambulatory Visit: Payer: Self-pay | Admitting: Obstetrics and Gynecology

## 2012-07-01 ENCOUNTER — Ambulatory Visit: Payer: Self-pay | Admitting: Family Medicine

## 2013-04-22 ENCOUNTER — Other Ambulatory Visit: Payer: Self-pay

## 2013-08-08 ENCOUNTER — Emergency Department (HOSPITAL_COMMUNITY)
Admission: EM | Admit: 2013-08-08 | Discharge: 2013-08-08 | Disposition: A | Discharge status: Home / Self Care | Payer: Self-pay | Attending: Emergency Medicine | Admitting: Emergency Medicine

## 2013-08-08 ENCOUNTER — Encounter (HOSPITAL_COMMUNITY): Payer: Self-pay | Admitting: Emergency Medicine

## 2013-08-08 DIAGNOSIS — J069 Acute upper respiratory infection, unspecified: Secondary | ICD-10-CM | POA: Insufficient documentation

## 2013-08-08 DIAGNOSIS — Z87891 Personal history of nicotine dependence: Secondary | ICD-10-CM | POA: Insufficient documentation

## 2013-08-08 DIAGNOSIS — K219 Gastro-esophageal reflux disease without esophagitis: Secondary | ICD-10-CM | POA: Insufficient documentation

## 2013-08-08 DIAGNOSIS — Z791 Long term (current) use of non-steroidal anti-inflammatories (NSAID): Secondary | ICD-10-CM | POA: Insufficient documentation

## 2013-08-08 DIAGNOSIS — F329 Major depressive disorder, single episode, unspecified: Secondary | ICD-10-CM | POA: Insufficient documentation

## 2013-08-08 DIAGNOSIS — Z3202 Encounter for pregnancy test, result negative: Secondary | ICD-10-CM | POA: Insufficient documentation

## 2013-08-08 DIAGNOSIS — F3289 Other specified depressive episodes: Secondary | ICD-10-CM | POA: Insufficient documentation

## 2013-08-08 DIAGNOSIS — Z79899 Other long term (current) drug therapy: Secondary | ICD-10-CM | POA: Insufficient documentation

## 2013-08-08 DIAGNOSIS — R12 Heartburn: Secondary | ICD-10-CM | POA: Insufficient documentation

## 2013-08-08 LAB — URINALYSIS, ROUTINE W REFLEX MICROSCOPIC
Bilirubin Urine: NEGATIVE
GLUCOSE, UA: NEGATIVE mg/dL
Ketones, ur: NEGATIVE mg/dL
Nitrite: NEGATIVE
PH: 6.5 (ref 5.0–8.0)
Protein, ur: NEGATIVE mg/dL
SPECIFIC GRAVITY, URINE: 1.028 (ref 1.005–1.030)
Urobilinogen, UA: 0.2 mg/dL (ref 0.0–1.0)

## 2013-08-08 LAB — URINE MICROSCOPIC-ADD ON

## 2013-08-08 LAB — RAPID STREP SCREEN (MED CTR MEBANE ONLY): Streptococcus, Group A Screen (Direct): NEGATIVE

## 2013-08-08 LAB — PREGNANCY, URINE: PREG TEST UR: NEGATIVE

## 2013-08-08 MED ORDER — BENZONATATE 100 MG PO CAPS
100.0000 mg | ORAL_CAPSULE | Freq: Once | ORAL | Status: AC
  Administered 2013-08-08: 100 mg via ORAL
  Filled 2013-08-08: qty 1

## 2013-08-08 MED ORDER — NAPROXEN 500 MG PO TABS
500.0000 mg | ORAL_TABLET | Freq: Once | ORAL | Status: AC
  Administered 2013-08-08: 500 mg via ORAL
  Filled 2013-08-08: qty 1

## 2013-08-08 MED ORDER — NAPROXEN 500 MG PO TABS: 500.0000 mg | ORAL_TABLET | Freq: Two times a day (BID) | ORAL | Status: DC

## 2013-08-08 MED ORDER — BENZONATATE 100 MG PO CAPS: 100.0000 mg | ORAL_CAPSULE | Freq: Three times a day (TID) | ORAL | Status: DC

## 2013-08-08 NOTE — ED Provider Notes (Signed)
CSN: [id number]     Arrival date & time 08/08/13  0131 History   First MD Initiated Contact with Patient 08/08/13 0159     Chief Complaint  Patient presents with  . Sore Throat     (Consider location/radiation/quality/duration/timing/severity/associated sxs/prior Treatment) HPI Comments: 28 year old female with a history of no significant medical problems presents with a complaint of sore throat. This has been going on for several days, it is associated with a runny nose, sore throat, chills, dry cough. She has had several children with similar symptoms over the last week which have resolved spontaneously. She denies objective fevers, denies rashes, denies abdominal pain but does endorse having acid reflux type symptoms including burning in her chest which gets worse after eating acid rich foods and laying down at night. She denies any dysuria, she is unsure if she is pregnant, she has no other complaints at this time.  Patient is a 28 y.o. female presenting with pharyngitis. The history is provided by the patient.  Sore Throat    Past Medical History  Diagnosis Date  . Depression    Past Surgical History  Procedure Laterality Date  . Dilation and curettage of uterus     Family History  Problem Relation Age of Onset  . Anesthesia problems Neg Hx   . Hypotension Neg Hx   . Malignant hyperthermia Neg Hx   . Pseudochol deficiency Neg Hx   . Diabetes Mother   . Hypertension Mother   . Hypertension Father   . Asthma Sister    History  Substance Use Topics  . Smoking status: Former Smoker    Types: Cigars    Quit date: 08/23/2011  . Smokeless tobacco: Never Used  . Alcohol Use: Yes     Comment: occ   OB History   Grav Para Term Preterm Abortions TAB SAB Ect Mult Living   7 3 3  4 3 1   3      Review of Systems  All other systems reviewed and are negative.      Allergies  Review of patient's allergies indicates no known allergies.  Home Medications   Current  Outpatient Rx  Name  Route  Sig  Dispense  Refill  . HYDROcodone-homatropine (HYCODAN) 5-1.5 MG/5ML syrup   Oral   Take 5 mLs by mouth every 6 (six) hours as needed for cough.         . Multiple Vitamin (MULTIVITAMIN WITH MINERALS) TABS tablet   Oral   Take 1 tablet by mouth daily.         . benzonatate (TESSALON) 100 MG capsule   Oral   Take 1 capsule (100 mg total) by mouth every 8 (eight) hours.   21 capsule   0   . naproxen (NAPROSYN) 500 MG tablet   Oral   Take 1 tablet (500 mg total) by mouth 2 (two) times daily with a meal.   30 tablet   0    BP 113/67  Pulse 87  Temp(Src) 98.5 F (36.9 C) (Oral)  Resp 14  Ht 5\' 3"  (1.6 m)  SpO2 96%  LMP 07/15/2013  Breastfeeding? No Physical Exam  Nursing note and vitals reviewed. Constitutional: She appears well-developed and well-nourished. No distress.  HENT:  Head: Normocephalic and atraumatic.  Mouth/Throat: Oropharynx is clear and moist. No oropharyngeal exudate.  Bilateral erythematous tonsils, no exudate asymmetry or hypertrophy, nasal passages are clear, mucous membranes are moist, phonation is normal  Eyes: Conjunctivae and EOM are normal. Pupils  are equal, round, and reactive to light. Right eye exhibits no discharge. Left eye exhibits no discharge. No scleral icterus.  Neck: Normal range of motion. Neck supple. No JVD present. No thyromegaly present.  Cardiovascular: Normal rate, regular rhythm, normal heart sounds and intact distal pulses.  Exam reveals no gallop and no friction rub.   No murmur heard. Pulmonary/Chest: Effort normal and breath sounds normal. No respiratory distress. She has no wheezes. She has no rales.  Abdominal: Soft. Bowel sounds are normal. She exhibits no distension and no mass. There is no tenderness.  Musculoskeletal: Normal range of motion. She exhibits no edema and no tenderness.  Lymphadenopathy:    She has no cervical adenopathy.  Neurological: She is alert. Coordination normal.   Skin: Skin is warm and dry. No rash noted. No erythema.  Psychiatric: She has a normal mood and affect. Her behavior is normal.    ED Course  Procedures (including critical care time) Labs Review Labs Reviewed  URINALYSIS, ROUTINE W REFLEX MICROSCOPIC - Abnormal; Notable for the following:    Hgb urine dipstick TRACE (*)    Leukocytes, UA SMALL (*)    All other components within normal limits  URINE MICROSCOPIC-ADD ON - Abnormal; Notable for the following:    Squamous Epithelial / LPF FEW (*)    Bacteria, UA FEW (*)    All other components within normal limits  RAPID STREP SCREEN  CULTURE, GROUP A STREP  PREGNANCY, URINE   Imaging Review No results found.  EKG Interpretation   None       MDM   Final diagnoses:  Upper respiratory infection  GERD (gastroesophageal reflux disease)    The patient has normal vital signs, benign exam without exudate on the tonsils and without lymphadenopathy or fever. Strep test is negative, check urinalysis and urine pregnancy, start Pepcid as outpatient, supportive care for viral upper respiratory infection.  Strep neg, ua neg, preg neg, stable for d/c.  Meds given in ED:  Medications  naproxen (NAPROSYN) tablet 500 mg (500 mg Oral Given 08/08/13 0351)  benzonatate (TESSALON) capsule 100 mg (100 mg Oral Given 08/08/13 0351)    New Prescriptions   BENZONATATE (TESSALON) 100 MG CAPSULE    Take 1 capsule (100 mg total) by mouth every 8 (eight) hours.   NAPROXEN (NAPROSYN) 500 MG TABLET    Take 1 tablet (500 mg total) by mouth 2 (two) times daily with a meal.      Vida Roller, MD 08/08/13 [phone]

## 2013-08-08 NOTE — Discharge Instructions (Signed)
°Emergency Department Resource Guide °1) Find a Doctor and Pay Out of Pocket °Although you won't have to find out who is covered by your insurance plan, it is a good idea to ask around and get recommendations. You will then need to call the office and see if the doctor you have chosen will accept you as a new patient and what types of options they offer for patients who are self-pay. Some doctors offer discounts or will set up payment plans for their patients who do not have insurance, but you will need to ask so you aren't surprised when you get to your appointment. ° °2) Contact Your Local Health Department °Not all health departments have doctors that can see patients for sick visits, but many do, so it is worth a call to see if yours does. If you don't know where your local health department is, you can check in your phone book. The CDC also has a tool to help you locate your state's health department, and many state websites also have listings of all of their local health departments. ° °3) Find a Walk-in Clinic °If your illness is not likely to be very severe or complicated, you may want to try a walk in clinic. These are popping up all over the country in pharmacies, drugstores, and shopping centers. They're usually staffed by nurse practitioners or physician assistants that have been trained to treat common illnesses and complaints. They're usually fairly quick and inexpensive. However, if you have serious medical issues or chronic medical problems, these are probably not your best option. ° °No Primary Care Doctor: °- Call [facility] at  832-8000 - they can help you locate a primary care doctor that  accepts your insurance, provides certain services, etc. °- Physician Referral Service- 1-800-533-3463 ° °Chronic Pain Problems: °Organization         Address  Phone   Notes  °[facility]  (336) 297-2271 Patients need to be referred by their primary care doctor.  ° °Medication  Assistance: °Organization         Address  Phone   Notes  °[place] [facility] [address] °[place], [place] [address] (336) 641-8030 --Must be a resident of [place] °-- Must have NO insurance coverage whatsoever (no Medicaid/ Medicare, etc.) °-- The pt. MUST have a primary care doctor that directs their care regularly and follows them in the community °  °[facility]  (866) 331-1348   °[employer]  (888) 892-1162   ° °Agencies that provide inexpensive medical care: °Organization         Address  Phone   Notes  °[facility]  (336) 832-8035   °[facility]    (336) 832-7272   °[facility] [address] °[place], [place] [address] (336) 832-4777   °[facility] [address], °[place] (336) 271-4999   °[facility]    (336) 373-0678   °[facility]    (336) 272-1050   °[facility] ° [address], [place] Phone:  (336) 832-4444, Fax:  (336) 832-4440 Hours of Operation:  9 am - 6 pm, M-F.  Also accepts Medicaid/Medicare and self-pay.  °[facility] ° [address], [place] Phone: (336) 832-3150, Fax: (336) 832-3151. Hours of Operation:  8:30 am - 5:30 pm, M-F.  Also accepts Medicaid and self-pay.  °[facility] [place] [address], [place] Phone: (336) 878-6027   °[facility] [address], [place], [place] (336)723-1848, Ext. 123 Mondays & Thursdays: 7-9 AM.  First 15 patients are seen on a first come, first serve basis. °  ° °Medicaid-accepting [place] Providers: ° °Organization         Address  Phone   Notes  °[facility] [address], [place] (336) 641-2100 Also accepts self-pay patients.  °[facility] [address], [place] ° (336) 856-9996   °[facility] [address], [place]  (336) 288-8857   °[facility] [address] [place] [address], [place] (336) 299-7000   °Veita Bland [address], [place]  ° (336) 373-1557 Only accepts [place] Access Medicaid patients after they have their name applied to their card.  ° °Self-Pay (no insurance) in [place]: ° °Organization         Address  Phone   Notes  °Sickle Cell Patients, [facility] [address], [place] (336) 832-1970   °[facility] [address], [place] (336) 832-4400   °[facility] ° [address], [place] (336) 992-4800   °[facility]/Dr. Osei-Bonsu ° [address] [place] [address], [place] or [address], [place] (336) 841-8500 Phone number for both [place] and [place] locations is the same.  °[facility] [address], [place] (336) 299-0000   °[facility] [address] [place] [address], [place] or [address] (336) 852-7530 °(336) 878-2260   °[facility] [address], [place] (336) 350-1642, phone; (336) 294-5005, fax Sees patients 1st and 3rd Saturday of every month.  Must not qualify for public or private insurance (i.e. Medicaid, Medicare, [place] Health Choice, Veterans' Benefits) • Household income should be no more than 200% of the poverty level •The clinic cannot treat you if you are pregnant or think you are pregnant • Sexually transmitted diseases are not treated at the clinic.  ° ° °Dental Care: °Organization         Address  Phone  Notes  °[place] [facility] [address], [place] (336) 641-6152 Accepts children up to age 21 who are enrolled in Medicaid or [place] Health Choice; pregnant women with a Medicaid card; and children who have applied for Medicaid or [place] Health Choice, but were declined, whose parents can pay a reduced fee at time of service.  °[place]  [facility] [place]  [address], [place] (336) 641-7733 Accepts children up to age 21 who are enrolled in Medicaid or [place] Health Choice; pregnant women with a Medicaid card; and children who have applied for Medicaid or  Health Choice, but were declined, whose parents can pay a reduced fee at time of service.  °[facility] ° [address], [place] (336) 641-4533 Patients are seen by appointment only. Walk-ins are not accepted. [facility] will see patients 18 years of age and older. °Monday - Tuesday (8am-5pm) °Most Wednesdays (8:30-5pm) °$30 per visit, cash only  °[facility] ° [address], [place] (336) 641-4533 Patients are seen by appointment only. Walk-ins are not accepted. [facility] will see patients 18 years of age and older. °One   Wednesday Evening (Monthly: Volunteer Based).  $30 per visit, cash only  °[facility]  (919) 537-3737 for adults; Children under age 4, call Graduate Pediatric Dentistry at (919) [phone]. Children aged 4-14, please call (919) [phone] to request a pediatric application. ° Dental services are provided in all areas of dental care including fillings, crowns and bridges, complete and partial dentures, implants, gum treatment, root canals, and extractions. Preventive care is also provided. Treatment is provided to both adults and children. °Patients are selected via a lottery and there is often a waiting list. °  °[facility] [address], °[place] ° (336) 763-8833 [email or web address] °  °[facility] [address], [place], [place] (336)723-1848, Ext. 123 Second and Fourth Thursday of each month, opens at 6:30 AM; Clinic ends at 9 AM.  Patients are seen on a first-come first-served basis, and a limited number are seen during each clinic.  ° °[facility] ° [address], [place], [place] (336) 723-7904    Eligibility Requirements °You must have lived in [place], [place], or [place] counties for at least the last three months. °  You cannot be eligible for state or federal sponsored healthcare insurance, including Veterans Administration, Medicaid, or Medicare. °  You generally cannot be eligible for healthcare insurance through your employer.  °  How to apply: °Eligibility screenings are held every Tuesday and Wednesday afternoon from 1:00 pm until 4:00 pm. You do not need an appointment for the interview!  °[facility] [address], [place], [place] 336-631-2330   °[facility]  336-342-8273   °[facility]  336-703-3100   °[facility]  336-570-6415   ° °Behavioral Health Resources in the Community: °Intensive Outpatient Programs °Organization         Address  Phone  Notes  °[facility] [address], [place], [place] 336-878-6098   °[facility] [address], [place], [place] 336-832-9800   °[facility] [address], [place], [place] ° 336-882-2125   °[facility] [address],  °[place], [place] 1-800-853-5163 or 336-641-4981   °Substance Abuse Resources °Organization         Address  Phone  Notes  °[facility]  336-882-2125   °[facility]  336-784-9470   °[facility]  336-285-9073   °[facility]  336-845-3988   °Residential & Outpatient Substance Abuse Program  1-800-659-3381   °Psychological Services °Organization         Address  Phone  Notes  °[facility]  336- 832-9600   °[facility]  336- 378-7881   °[facility] [address], [place] 1-800-853-5163 or 336-641-4981   ° °Mobile Crisis Teams °Organization         Address  Phone  Notes  °[facility], Mobile Crisis Care Unit  1-877-626-1772   °Assertive °[facility] ° [address]  [place], [place] 336-834-9664   °Sharon DeEsch [address] °[place] 336-554-5454   ° °Self-Help/Support Groups °Organization         Address  Phone             Notes  °[facility] - variety of support groups  336- 373-1402 Call for more information  °Narcotics Anonymous (NA), [facility] [address], °[place]   2 meetings at this location  ° °  Residential Treatment Programs °Organization         Address  Phone  Notes  °[facility] [address],    °[place]  1-866-801-8205   °[facility] ° [address], [place], [place] 704-293-8524   °[facility] [address], [place] 336-845-3988 Admissions: 8am-3pm M-F  °[facility] [address]    °[place], [place] 336-841-1104   °[facility] [address], [place], [place] 336-379-7146   °[facility] [address]  °[place], [place] 336-285-9073   °[facility] - Intensive Outpatient [address], [place], [place] 336-852-3033   °[facility] [address]  °[place], [place] 1-877-615-2722 or 336-784-9470   °[facility] [address] [place], [place] 336-227-7417 Accepts Medicaid  °[facility] [address]  °[place] 1-800-659-3381 Substance Abuse/Addiction Treatment  ° °[place] Behavioral Health Resources °Organization         Address  Phone  Notes  °[facility]  (888) 581-9988   °Julie Brannon, PhD [address] [place], [place]   (336) 349-5553 or (336) 951-0000   °[facility]   [address] °[place], [place] (336) 349-4454   °[facility] [address], [place], [place] (336) 342-8316 Insurance/Medicaid/sponsorship through [facility]  °[facility] [address]                                    [place], [place] (336) 342-8316 Therapy/tele-psych/case    °[facility] [address]  ° [place],  (336) 349-2233    °Dr. Arfeen  (336) 349-4544   °[facility] [place]  [employer] [place] [facility] 1) [address], [place] °2) [address], [place] °3)  [address], [place] (336) 349-3220 °(336) 342-7768 ° °(336) 342-8140   °[place] [facility] (336) 342-1394 or (336) 342-3537 (After Hours)    ° ° °

## 2013-08-08 NOTE — ED Notes (Addendum)
Pt c/o sore throat x 1 week. +nausea, nasal congestion, pt would like a UA to rule out UTI and pelvic exam d/t white, malodorous discharge.

## 2013-08-10 LAB — CULTURE, GROUP A STREP

## 2014-01-12 ENCOUNTER — Encounter (HOSPITAL_COMMUNITY): Payer: Self-pay | Admitting: Emergency Medicine

## 2014-01-12 ENCOUNTER — Emergency Department (HOSPITAL_COMMUNITY): Payer: No Typology Code available for payment source

## 2014-01-12 ENCOUNTER — Emergency Department (HOSPITAL_COMMUNITY)
Admission: EM | Admit: 2014-01-12 | Discharge: 2014-01-12 | Disposition: A | Discharge status: Home / Self Care | Payer: No Typology Code available for payment source | Attending: Emergency Medicine | Admitting: Emergency Medicine

## 2014-01-12 DIAGNOSIS — Z8659 Personal history of other mental and behavioral disorders: Secondary | ICD-10-CM | POA: Insufficient documentation

## 2014-01-12 DIAGNOSIS — Y92410 Unspecified street and highway as the place of occurrence of the external cause: Secondary | ICD-10-CM | POA: Insufficient documentation

## 2014-01-12 DIAGNOSIS — Y9389 Activity, other specified: Secondary | ICD-10-CM | POA: Insufficient documentation

## 2014-01-12 DIAGNOSIS — S79929A Unspecified injury of unspecified thigh, initial encounter: Secondary | ICD-10-CM

## 2014-01-12 DIAGNOSIS — S0993XA Unspecified injury of face, initial encounter: Secondary | ICD-10-CM | POA: Insufficient documentation

## 2014-01-12 DIAGNOSIS — T148XXA Other injury of unspecified body region, initial encounter: Secondary | ICD-10-CM

## 2014-01-12 DIAGNOSIS — S79919A Unspecified injury of unspecified hip, initial encounter: Secondary | ICD-10-CM | POA: Insufficient documentation

## 2014-01-12 DIAGNOSIS — IMO0002 Reserved for concepts with insufficient information to code with codable children: Secondary | ICD-10-CM | POA: Insufficient documentation

## 2014-01-12 DIAGNOSIS — Z87891 Personal history of nicotine dependence: Secondary | ICD-10-CM | POA: Insufficient documentation

## 2014-01-12 DIAGNOSIS — S199XXA Unspecified injury of neck, initial encounter: Secondary | ICD-10-CM

## 2014-01-12 MED ORDER — CYCLOBENZAPRINE HCL 10 MG PO TABS: 10.0000 mg | ORAL_TABLET | Freq: Three times a day (TID) | ORAL | Status: DC | PRN

## 2014-01-12 MED ORDER — IBUPROFEN 800 MG PO TABS: 800.0000 mg | ORAL_TABLET | Freq: Three times a day (TID) | ORAL | Status: DC

## 2014-01-12 MED ORDER — IBUPROFEN 800 MG PO TABS
800.0000 mg | ORAL_TABLET | Freq: Once | ORAL | Status: AC
  Administered 2014-01-12: 800 mg via ORAL
  Filled 2014-01-12: qty 1
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
## 2014-01-12 NOTE — ED Notes (Signed)
Pt to radiology.
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
## 2014-01-14 NOTE — ED Provider Notes (Signed)
Medical screening examination/treatment/procedure(s) were performed by non-physician practitioner and as supervising physician I was immediately available for consultation/collaboration.   EKG Interpretation None       Raeford RazorStephen Venba Zenner, MD 01/14/14 2210

## 2014-01-23 ENCOUNTER — Inpatient Hospital Stay (HOSPITAL_COMMUNITY)
Admission: AD | Admit: 2014-01-23 | Discharge: 2014-01-23 | Disposition: A | Discharge status: Home / Self Care | Payer: Medicaid Other | Source: Ambulatory Visit | Attending: Obstetrics & Gynecology | Admitting: Obstetrics & Gynecology

## 2014-01-23 ENCOUNTER — Encounter (HOSPITAL_COMMUNITY): Payer: Self-pay | Admitting: *Deleted

## 2014-01-23 DIAGNOSIS — Z87891 Personal history of nicotine dependence: Secondary | ICD-10-CM | POA: Insufficient documentation

## 2014-01-23 DIAGNOSIS — A499 Bacterial infection, unspecified: Secondary | ICD-10-CM | POA: Insufficient documentation

## 2014-01-23 DIAGNOSIS — B9689 Other specified bacterial agents as the cause of diseases classified elsewhere: Secondary | ICD-10-CM

## 2014-01-23 DIAGNOSIS — K219 Gastro-esophageal reflux disease without esophagitis: Secondary | ICD-10-CM

## 2014-01-23 DIAGNOSIS — R109 Unspecified abdominal pain: Secondary | ICD-10-CM | POA: Insufficient documentation

## 2014-01-23 DIAGNOSIS — N760 Acute vaginitis: Secondary | ICD-10-CM | POA: Insufficient documentation

## 2014-01-23 LAB — URINALYSIS, ROUTINE W REFLEX MICROSCOPIC
Bilirubin Urine: NEGATIVE
Glucose, UA: NEGATIVE mg/dL
Ketones, ur: NEGATIVE mg/dL
Leukocytes, UA: NEGATIVE
Nitrite: NEGATIVE
Protein, ur: NEGATIVE mg/dL
Specific Gravity, Urine: 1.015 (ref 1.005–1.030)
UROBILINOGEN UA: 0.2 mg/dL (ref 0.0–1.0)
pH: 7 (ref 5.0–8.0)

## 2014-01-23 LAB — CBC
HEMATOCRIT: 32.4 % — AB (ref 36.0–46.0)
Hemoglobin: 10.2 g/dL — ABNORMAL LOW (ref 12.0–15.0)
MCH: 25.4 pg — ABNORMAL LOW (ref 26.0–34.0)
MCHC: 31.5 g/dL (ref 30.0–36.0)
MCV: 80.6 fL (ref 78.0–100.0)
Platelets: 294 10*3/uL (ref 150–400)
RBC: 4.02 MIL/uL (ref 3.87–5.11)
RDW: 14.1 % (ref 11.5–15.5)
WBC: 10.8 10*3/uL — ABNORMAL HIGH (ref 4.0–10.5)

## 2014-01-23 LAB — POCT PREGNANCY, URINE: Preg Test, Ur: NEGATIVE

## 2014-01-23 LAB — WET PREP, GENITAL
Trich, Wet Prep: NONE SEEN
YEAST WET PREP: NONE SEEN

## 2014-01-23 LAB — URINE MICROSCOPIC-ADD ON

## 2014-01-23 MED ORDER — FLUCONAZOLE 150 MG PO TABS: 150.0000 mg | ORAL_TABLET | Freq: Every day | ORAL | Status: DC

## 2014-01-23 MED ORDER — METRONIDAZOLE 500 MG PO TABS: 500.0000 mg | ORAL_TABLET | Freq: Two times a day (BID) | ORAL | Status: DC

## 2014-01-23 MED ORDER — GI COCKTAIL ~~LOC~~
30.0000 mL | Freq: Once | ORAL | Status: AC
  Administered 2014-01-23: 30 mL via ORAL
  Filled 2014-01-23: qty 30

## 2014-01-23 MED ORDER — KETOROLAC TROMETHAMINE 10 MG PO TABS
10.0000 mg | ORAL_TABLET | Freq: Once | ORAL | Status: AC
  Administered 2014-01-23: 10 mg via ORAL
  Filled 2014-01-23: qty 1
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
## 2014-01-23 NOTE — Discharge Instructions (Signed)
Food Choices for Gastroesophageal Reflux Disease When you have gastroesophageal reflux disease (GERD), the foods you eat and your eating habits are very important. Choosing the right foods can help ease the discomfort of GERD. WHAT GENERAL GUIDELINES DO I NEED TO FOLLOW?  Choose fruits, vegetables, whole grains, low-fat dairy products, and low-fat meat, fish, and poultry.  Limit fats such as oils, salad dressings, butter, nuts, and avocado.  Keep a food diary to identify foods that cause symptoms.  Avoid foods that cause reflux. These may be different for different people.  Eat frequent small meals instead of three large meals each day.  Eat your meals slowly, in a relaxed setting.  Limit fried foods.  Cook foods using methods other than frying.  Avoid drinking alcohol.  Avoid drinking large amounts of liquids with your meals.  Avoid bending over or lying down until 2-3 hours after eating. WHAT FOODS ARE NOT RECOMMENDED? The following are some foods and drinks that may worsen your symptoms: Vegetables Tomatoes. Tomato juice. Tomato and spaghetti sauce. Chili peppers. Onion and garlic. Horseradish. Fruits Oranges, grapefruit, and lemon (fruit and juice). Meats High-fat meats, fish, and poultry. This includes hot dogs, ribs, ham, sausage, salami, and bacon. Dairy Whole milk and chocolate milk. Sour cream. Cream. Butter. Ice cream. Cream cheese.  Beverages Coffee and tea, with or without caffeine. Carbonated beverages or energy drinks. Condiments Hot sauce. Barbecue sauce.  Sweets/Desserts Chocolate and cocoa. Donuts. Peppermint and spearmint. Fats and Oils High-fat foods, including [place] fries and potato chips. Other Vinegar. Strong spices, such as black pepper, white pepper, red pepper, cayenne, curry powder, cloves, ginger, and chili powder. The items listed above may not be a complete list of foods and beverages to avoid. Contact your dietitian for more  information. Document Released: 06/03/2005 Document Revised: 06/08/2013 Document Reviewed: 04/07/2013 [facility] Patient Information 2015 [place], [place]. This information is not intended to replace advice given to you by your health care provider. Make sure you discuss any questions you have with your health care provider. Bacterial Vaginosis Bacterial vaginosis is a vaginal infection that occurs when the normal balance of bacteria in the vagina is disrupted. It results from an overgrowth of certain bacteria. This is the most common vaginal infection in women of childbearing age. Treatment is important to prevent complications, especially in pregnant women, as it can cause a premature delivery. CAUSES  Bacterial vaginosis is caused by an increase in harmful bacteria that are normally present in smaller amounts in the vagina. Several different kinds of bacteria can cause bacterial vaginosis. However, the reason that the condition develops is not fully understood. RISK FACTORS Certain activities or behaviors can put you at an increased risk of developing bacterial vaginosis, including:  Having a new sex partner or multiple sex partners.  Douching.  Using an intrauterine device (IUD) for contraception. Women do not get bacterial vaginosis from toilet seats, bedding, swimming pools, or contact with objects around them. SIGNS AND SYMPTOMS  Some women with bacterial vaginosis have no signs or symptoms. Common symptoms include:  Grey vaginal discharge.  A fishlike odor with discharge, especially after sexual intercourse.  Itching or burning of the vagina and vulva.  Burning or pain with urination. DIAGNOSIS  Your health care provider will take a medical history and examine the vagina for signs of bacterial vaginosis. A sample of vaginal fluid may be taken. Your health care provider will look at this sample under a microscope to check for bacteria and abnormal cells. A vaginal pH  test may also be  done.  TREATMENT  Bacterial vaginosis may be treated with antibiotic medicines. These may be given in the form of a pill or a vaginal cream. A second round of antibiotics may be prescribed if the condition comes back after treatment.  HOME CARE INSTRUCTIONS   Only take over-the-counter or prescription medicines as directed by your health care provider.  If antibiotic medicine was prescribed, take it as directed. Make sure you finish it even if you start to feel better.  Do not have sex until treatment is completed.  Tell all sexual partners that you have a vaginal infection. They should see their health care provider and be treated if they have problems, such as a mild rash or itching.  Practice safe sex by using condoms and only having one sex partner. SEEK MEDICAL CARE IF:   Your symptoms are not improving after 3 days of treatment.  You have increased discharge or pain.  You have a fever. MAKE SURE YOU:   Understand these instructions.  Will watch your condition.  Will get help right away if you are not doing well or get worse. FOR MORE INFORMATION  Centers for Disease Control and Prevention, Division of STD Prevention: Solution[employer]s.co.zawww.cdc.gov/std American Sexual Health Association ([place]): www.ashastd.org  Document Released: 06/03/2005 Document Revised: 03/24/2013 Document Reviewed: 01/13/2013 [facility] Patient Information 2015 [place], [place]. This information is not intended to replace advice given to you by your health care provider. Make sure you discuss any questions you have with your health care provider.

## 2014-01-23 NOTE — MAU Provider Note (Signed)
History     CSN: [id number]  Arrival date and time: 01/23/14 1804   First Provider Initiated Contact with Patient 01/23/14 1838      Chief Complaint  Patient presents with  . Abdominal Pain  . Flank Pain  . Vaginal Discharge   HPI Comments: Lauren Hayden 28 y.o. [id number]  Presents to MAU with vaginal discharge and pelvic pain that both started about one month ago. The pain comes and goes and is about 6/10 when it comes. She also has GERD and takes Mylanta for heartburn. She is currently not sexually active and has no birth control.   Abdominal Pain  Flank Pain Associated symptoms include abdominal pain.  Vaginal Discharge The patient's primary symptoms include a vaginal discharge. Associated symptoms include abdominal pain and flank pain.      Past Medical History  Diagnosis Date  . Depression     Past Surgical History  Procedure Laterality Date  . Dilation and curettage of uterus      Family History  Problem Relation Age of Onset  . Anesthesia problems Neg Hx   . Hypotension Neg Hx   . Malignant hyperthermia Neg Hx   . Pseudochol deficiency Neg Hx   . Diabetes Mother   . Hypertension Mother   . Hypertension Father   . Asthma Sister     History  Substance Use Topics  . Smoking status: Former Smoker    Types: Cigars    Quit date: 08/23/2011  . Smokeless tobacco: Never Used  . Alcohol Use: Yes     Comment: occ    Allergies: No Known Allergies  Prescriptions prior to admission  Medication Sig Dispense Refill  . alum & mag hydroxide-simeth (MAALOX/MYLANTA) 200-200-20 MG/5ML suspension Take 30 mLs by mouth every 6 (six) hours as needed for indigestion or heartburn.      . cyclobenzaprine (FLEXERIL) 10 MG tablet Take 1 tablet (10 mg total) by mouth 3 (three) times daily as needed for muscle spasms.  30 tablet  0  . ibuprofen (ADVIL,MOTRIN) 800 MG tablet Take 1 tablet (800 mg total) by mouth 3 (three) times daily.  21 tablet  0    Review of Systems   Constitutional: Negative.   Eyes: Negative.   Respiratory: Negative.   Cardiovascular: Negative.   Gastrointestinal: Positive for abdominal pain.  Genitourinary: Positive for flank pain and vaginal discharge.       Vaginal discharge  Skin: Negative.   Neurological: Negative.   Psychiatric/Behavioral: Negative.    Physical Exam   Blood pressure 104/61, pulse 90, temperature 98.3 F (36.8 C), temperature source Oral, resp. rate 16, height 5' 3.5" (1.613 m), weight 98.941 kg (218 lb 2 oz), last menstrual period 01/13/2014, not currently breastfeeding.  Physical Exam  Constitutional: She is oriented to person, place, and time. She appears well-developed and well-nourished. No distress.  HENT:  Head: Normocephalic and atraumatic.  Has a black eye on left eye  Eyes: Conjunctivae are normal. Pupils are equal, round, and reactive to light.  Cardiovascular: Normal rate, regular rhythm and normal heart sounds.   Respiratory: Effort normal and breath sounds normal.  GI: Soft. Bowel sounds are normal.  Genitourinary:  Genital:External negative Vaginal: Small amount white discharge Cervix:closed/ thick Bimanual:nontender   Musculoskeletal: Normal range of motion.  Neurological: She is alert and oriented to person, place, and time.  Skin: Skin is warm and dry.  Psychiatric: She has a normal mood and affect. Her behavior is normal. Judgment and thought content normal.  Results for orders placed during the hospital encounter of 01/23/14 (from the past 24 hour(s))  URINALYSIS, ROUTINE W REFLEX MICROSCOPIC     Status: Abnormal   Collection Time    01/23/14  6:20 PM      Result Value Ref Range   Color, Urine YELLOW  YELLOW   APPearance CLEAR  CLEAR   Specific Gravity, Urine 1.015  1.005 - 1.030   pH 7.0  5.0 - 8.0   Glucose, UA NEGATIVE  NEGATIVE mg/dL   Hgb urine dipstick SMALL (*) NEGATIVE   Bilirubin Urine NEGATIVE  NEGATIVE   Ketones, ur NEGATIVE  NEGATIVE mg/dL   Protein, ur  NEGATIVE  NEGATIVE mg/dL   Urobilinogen, UA 0.2  0.0 - 1.0 mg/dL   Nitrite NEGATIVE  NEGATIVE   Leukocytes, UA NEGATIVE  NEGATIVE  URINE MICROSCOPIC-ADD ON     Status: Abnormal   Collection Time    01/23/14  6:20 PM      Result Value Ref Range   Squamous Epithelial / LPF FEW (*) RARE   WBC, UA 0-2  <3 WBC/hpf   RBC / HPF 0-2  <3 RBC/hpf   Bacteria, UA FEW (*) RARE  POCT PREGNANCY, URINE     Status: None   Collection Time    01/23/14  6:30 PM      Result Value Ref Range   Preg Test, Ur NEGATIVE  NEGATIVE  WET PREP, GENITAL     Status: Abnormal   Collection Time    01/23/14  6:46 PM      Result Value Ref Range   Yeast Wet Prep HPF POC NONE SEEN  NONE SEEN   Trich, Wet Prep NONE SEEN  NONE SEEN   Clue Cells Wet Prep HPF POC FEW (*) NONE SEEN   WBC, Wet Prep HPF POC FEW (*) NONE SEEN  CBC     Status: Abnormal   Collection Time    01/23/14  6:59 PM      Result Value Ref Range   WBC 10.8 (*) 4.0 - 10.5 K/uL   RBC 4.02  3.87 - 5.11 MIL/uL   Hemoglobin 10.2 (*) 12.0 - 15.0 g/dL   HCT 16.1 (*) 09.6 - 04.5 %   MCV 80.6  78.0 - 100.0 fL   MCH 25.4 (*) 26.0 - 34.0 pg   MCHC 31.5  30.0 - 36.0 g/dL   RDW 40.9  81.1 - 91.4 %   Platelets 294  150 - 400 K/uL     MAU Course  Procedures  MDM Wet prep, GC/ chlamydia GI cocktail and Toradol 10 mg / Pt feels much better now  Assessment and Plan   A: Bacterial vaginosis GERD  P: Flagyl 500 mg po BID x 7 days Diflucan 150 mg / 1 refill OTC Pepcid/ diet changes discussed Follow up with PCP/ MAU  Carolynn Serve 01/23/2014, 6:57 PM

## 2014-01-24 NOTE — MAU Provider Note (Signed)
Attestation of Attending Supervision of Advanced Practitioner (CNM/NP): Evaluation and management procedures were performed by the Advanced Practitioner under my supervision and collaboration. I have reviewed the Advanced Practitioner's note and chart, and I agree with the management and plan.  Moody Robben H. 6:34 AM   

## 2014-01-25 LAB — GC/CHLAMYDIA PROBE AMP
CT PROBE, AMP APTIMA: NEGATIVE
GC Probe RNA: NEGATIVE

## 2014-04-18 ENCOUNTER — Encounter (HOSPITAL_COMMUNITY): Payer: Self-pay | Admitting: *Deleted

## 2014-08-28 DIAGNOSIS — Z8659 Personal history of other mental and behavioral disorders: Secondary | ICD-10-CM | POA: Insufficient documentation

## 2014-08-28 DIAGNOSIS — Z87891 Personal history of nicotine dependence: Secondary | ICD-10-CM | POA: Insufficient documentation

## 2014-08-28 DIAGNOSIS — H9201 Otalgia, right ear: Secondary | ICD-10-CM | POA: Insufficient documentation

## 2014-08-29 ENCOUNTER — Encounter (HOSPITAL_COMMUNITY): Payer: Self-pay

## 2014-08-29 ENCOUNTER — Emergency Department (HOSPITAL_COMMUNITY)
Admission: EM | Admit: 2014-08-29 | Discharge: 2014-08-29 | Disposition: A | Discharge status: Home / Self Care | Payer: Self-pay | Attending: Emergency Medicine | Admitting: Emergency Medicine

## 2014-08-29 DIAGNOSIS — H9201 Otalgia, right ear: Secondary | ICD-10-CM

## 2014-08-29 MED ORDER — IBUPROFEN 800 MG PO TABS: 800.0000 mg | ORAL_TABLET | Freq: Three times a day (TID) | ORAL | Status: DC | PRN

## 2014-08-29 NOTE — ED Notes (Addendum)
Patient reports right side of face, throat, and ear hurt.  She reports a fever during the night at home.  Patient reports a piece of her earring could be inside her right ear.

## 2014-08-29 NOTE — ED Provider Notes (Signed)
CSN: [id number]     Arrival date & time 08/28/14  2344 History   First MD Initiated Contact with Patient 08/29/14 0019     Chief Complaint  Patient presents with  . Otalgia     (Consider location/radiation/quality/duration/timing/severity/associated sxs/prior Treatment) HPI   29 year old female presents complaining of suspect foreign body in right ear. Patient states that she has numerous ear piercing. She report missing the ball to one of the piercing despite replacing it twice and therefore she thinks it may be embedded in her right ear canal.  She suspect it may have been in her ear for the past 2 months.  In the past 5 days she report having pain to R side of face, with sneeze and cough and was concern of her R ear again.  Denies fever, hearing loss, tinitus, ear drainage, or pain to ear lobe.  Denies sore throat, dental pain, or neck pain.  No specific treatment tried.  No SOB.    Past Medical History  Diagnosis Date  . Depression    Past Surgical History  Procedure Laterality Date  . Dilation and curettage of uterus     Family History  Problem Relation Age of Onset  . Anesthesia problems Neg Hx   . Hypotension Neg Hx   . Malignant hyperthermia Neg Hx   . Pseudochol deficiency Neg Hx   . Diabetes Mother   . Hypertension Mother   . Hypertension Father   . Asthma Sister    History  Substance Use Topics  . Smoking status: Former Smoker    Types: Cigars    Quit date: 08/23/2011  . Smokeless tobacco: Never Used  . Alcohol Use: Yes     Comment: occ   OB History    Gravida Para Term Preterm AB TAB SAB Ectopic Multiple Living   7 3 3  4 3 1   3      Review of Systems  Constitutional: Negative for fever.  HENT: Positive for ear pain. Negative for ear discharge.   Skin: Negative for rash.  Neurological: Negative for numbness.      Allergies  Review of patient's allergies indicates no known allergies.  Home Medications   Prior to Admission medications    Medication Sig Start Date End Date Taking? Authorizing Provider  acetaminophen (TYLENOL) 500 MG tablet Take 1,000 mg by mouth every 8 (eight) hours as needed for mild pain.    Historical Provider, MD  alum & mag hydroxide-simeth (MAALOX/MYLANTA) 200-200-20 MG/5ML suspension Take 30 mLs by mouth every 6 (six) hours as needed for indigestion or heartburn.    Historical Provider, MD  cyclobenzaprine (FLEXERIL) 10 MG tablet Take 1 tablet (10 mg total) by mouth 3 (three) times daily as needed for muscle spasms. 01/12/14   Ivonne AndrewPeter Dammen, PA-C  fluconazole (DIFLUCAN) 150 MG tablet Take 1 tablet (150 mg total) by mouth daily. 01/23/14   Delbert PhenixLinda M Barefoot, NP  ibuprofen (ADVIL,MOTRIN) 800 MG tablet Take 1 tablet (800 mg total) by mouth 3 (three) times daily. 01/12/14   Ivonne AndrewPeter Dammen, PA-C  metroNIDAZOLE (FLAGYL) 500 MG tablet Take 1 tablet (500 mg total) by mouth 2 (two) times daily. 01/23/14   Delbert PhenixLinda M Barefoot, NP   BP 108/57 mmHg  Pulse 80  Temp(Src) 98.2 F (36.8 C) (Oral)  Resp 18  Ht 5\' 3"  (1.6 m)  Wt 204 lb (92.534 kg)  BMI 36.15 kg/m2  SpO2 95%  LMP 08/15/2014 (Approximate) Physical Exam  Constitutional: She appears well-developed and well-nourished. No  distress.  HENT:  Head: Atraumatic.  Mouth/Throat: Oropharynx is clear and moist.  R ear: several ear piercing, does not appear infected.  Earlobe nontender.  Ear canal without rash or edema, normal appearing R TM.  No fb noted.   Throat: uvula midline, no tonsilar enlargement or exudates, no dental pain.  No trismus   Eyes: Conjunctivae and EOM are normal. Pupils are equal, round, and reactive to light.  Neck: Neck supple.  No nuchal rigidity  Cardiovascular: Normal rate and regular rhythm.   Pulmonary/Chest: Effort normal and breath sounds normal. No respiratory distress. She has no wheezes. She exhibits no tenderness.  Lymphadenopathy:    She has no cervical adenopathy.  Neurological: She is alert.  Skin: No rash noted.  Psychiatric: She  has a normal mood and affect.  Nursing note and vitals reviewed.   ED Course  Procedures (including critical care time)  12:32 AM Pt was concerning of possible foreign object in her right ear for the past 2 months. There is no evidence of TM perforation or any signs of foreign object in her right ear. She may have some URI symptoms however she is afebrile with stable normal vital sign. Reassurance given. ENT referral given as needed. Return precautions discussed.  Labs Review Labs Reviewed - No data to display  Imaging Review No results found.   EKG Interpretation None      MDM   Final diagnoses:  Right ear pain    BP 108/57 mmHg  Pulse 80  Temp(Src) 98.2 F (36.8 C) (Oral)  Resp 18  Ht  (1.6 m)  Wt 204 lb (92.534 kg)  BMI 36.15 kg/m2  SpO2 95%  LMP 08/15/2014 (Approximate)     Fayrene Helper, PA-C 08/29/14 0036  Marisa Severin, MD 08/29/14 [phone]

## 2014-08-29 NOTE — Discharge Instructions (Signed)
Please follow up with ENT specialist if you still have discomfort to your right ear or facial pain

## 2014-11-10 ENCOUNTER — Other Ambulatory Visit (HOSPITAL_COMMUNITY)
Admission: RE | Admit: 2014-11-10 | Discharge: 2014-11-10 | Disposition: A | Discharge status: Home / Self Care | Payer: No Typology Code available for payment source | Source: Ambulatory Visit | Attending: Family Medicine | Admitting: Family Medicine

## 2014-11-10 ENCOUNTER — Encounter (HOSPITAL_COMMUNITY): Payer: Self-pay | Admitting: Emergency Medicine

## 2014-11-10 ENCOUNTER — Emergency Department (INDEPENDENT_AMBULATORY_CARE_PROVIDER_SITE_OTHER)
Admission: EM | Admit: 2014-11-10 | Discharge: 2014-11-10 | Disposition: A | Discharge status: Home / Self Care | Payer: Self-pay | Source: Home / Self Care | Attending: Family Medicine | Admitting: Family Medicine

## 2014-11-10 DIAGNOSIS — M545 Low back pain, unspecified: Secondary | ICD-10-CM

## 2014-11-10 DIAGNOSIS — J069 Acute upper respiratory infection, unspecified: Secondary | ICD-10-CM

## 2014-11-10 DIAGNOSIS — Z113 Encounter for screening for infections with a predominantly sexual mode of transmission: Secondary | ICD-10-CM | POA: Insufficient documentation

## 2014-11-10 DIAGNOSIS — N760 Acute vaginitis: Secondary | ICD-10-CM

## 2014-11-10 LAB — POCT PREGNANCY, URINE: PREG TEST UR: NEGATIVE

## 2014-11-10 MED ORDER — NAPROXEN 375 MG PO TABS: 375.0000 mg | ORAL_TABLET | Freq: Two times a day (BID) | ORAL | Status: DC

## 2014-11-10 MED ORDER — METRONIDAZOLE 500 MG PO TABS: 500.0000 mg | ORAL_TABLET | Freq: Two times a day (BID) | ORAL | Status: DC
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
## 2014-11-10 NOTE — Discharge Instructions (Signed)
Thank you for coming in today. If your belly pain worsens, or you have high fever, bad vomiting, blood in your stool or black tarry stool go to the Emergency Room.  Call or go to the emergency room if you get worse, have trouble breathing, have chest pains, or palpitations.  Come back or go to the emergency room if you notice new weakness new numbness problems walking or bowel or bladder problems. Naproxen twice daily for pain and fevers. Take metronidazole for vaginal discharge. Return as needed Use condoms next time.   Back Exercises Back exercises help treat and prevent back injuries. The goal of back exercises is to increase the strength of your abdominal and back muscles and the flexibility of your back. These exercises should be started when you no longer have back pain. Back exercises include:  Pelvic Tilt. Lie on your back with your knees bent. Tilt your pelvis until the lower part of your back is against the floor. Hold this position 5 to 10 sec and repeat 5 to 10 times.  Knee to Chest. Pull first 1 knee up against your chest and hold for 20 to 30 seconds, repeat this with the other knee, and then both knees. This may be done with the other leg straight or bent, whichever feels better.  Sit-Ups or Curl-Ups. Bend your knees 90 degrees. Start with tilting your pelvis, and do a partial, slow sit-up, lifting your trunk only 30 to 45 degrees off the floor. Take at least 2 to 3 seconds for each sit-up. Do not do sit-ups with your knees out straight. If partial sit-ups are difficult, simply do the above but with only tightening your abdominal muscles and holding it as directed.  Hip-Lift. Lie on your back with your knees flexed 90 degrees. Push down with your feet and shoulders as you raise your hips a couple inches off the floor; hold for 10 seconds, repeat 5 to 10 times.  Back arches. Lie on your stomach, propping yourself up on bent elbows. Slowly press on your hands, causing an arch in  your low back. Repeat 3 to 5 times. Any initial stiffness and discomfort should lessen with repetition over time.  Shoulder-Lifts. Lie face down with arms beside your body. Keep hips and torso pressed to floor as you slowly lift your head and shoulders off the floor. Do not overdo your exercises, especially in the beginning. Exercises may cause you some mild back discomfort which lasts for a few minutes; however, if the pain is more severe, or lasts for more than 15 minutes, do not continue exercises until you see your caregiver. Improvement with exercise therapy for back problems is slow.  See your caregivers for assistance with developing a proper back exercise program. Document Released: 07/11/2004 Document Revised: 08/26/2011 Document Reviewed: 04/04/2011 [facility] Patient Information 2015 [place], [place]. This information is not intended to replace advice given to you by your health care provider. Make sure you discuss any questions you have with your health care provider.  Upper Respiratory Infection, Adult An upper respiratory infection (URI) is also sometimes known as the common cold. The upper respiratory tract includes the nose, sinuses, throat, trachea, and bronchi. Bronchi are the airways leading to the lungs. Most people improve within 1 week, but symptoms can last up to 2 weeks. A residual cough may last even longer.  CAUSES Many different viruses can infect the tissues lining the upper respiratory tract. The tissues become irritated and inflamed and often become very moist. Mucus  production is also common. A cold is contagious. You can easily spread the virus to others by oral contact. This includes kissing, sharing a glass, coughing, or sneezing. Touching your mouth or nose and then touching a surface, which is then touched by another person, can also spread the virus. SYMPTOMS  Symptoms typically develop 1 to 3 days after you come in contact with a cold virus. Symptoms vary from person  to person. They may include:  Runny nose.  Sneezing.  Nasal congestion.  Sinus irritation.  Sore throat.  Loss of voice (laryngitis).  Cough.  Fatigue.  Muscle aches.  Loss of appetite.  Headache.  Low-grade fever. DIAGNOSIS  You might diagnose your own cold based on familiar symptoms, since most people get a cold 2 to 3 times a year. Your caregiver can confirm this based on your exam. Most importantly, your caregiver can check that your symptoms are not due to another disease such as strep throat, sinusitis, pneumonia, asthma, or epiglottitis. Blood tests, throat tests, and X-rays are not necessary to diagnose a common cold, but they may sometimes be helpful in excluding other more serious diseases. Your caregiver will decide if any further tests are required. RISKS AND COMPLICATIONS  You may be at risk for a more severe case of the common cold if you smoke cigarettes, have chronic heart disease (such as heart failure) or lung disease (such as asthma), or if you have a weakened immune system. The very young and very old are also at risk for more serious infections. Bacterial sinusitis, middle ear infections, and bacterial pneumonia can complicate the common cold. The common cold can worsen asthma and chronic obstructive pulmonary disease (COPD). Sometimes, these complications can require emergency medical care and may be life-threatening. PREVENTION  The best way to protect against getting a cold is to practice good hygiene. Avoid oral or hand contact with people with cold symptoms. Wash your hands often if contact occurs. There is no clear evidence that vitamin C, vitamin E, echinacea, or exercise reduces the chance of developing a cold. However, it is always recommended to get plenty of rest and practice good nutrition. TREATMENT  Treatment is directed at relieving symptoms. There is no cure. Antibiotics are not effective, because the infection is caused by a virus, not by  bacteria. Treatment may include:  Increased fluid intake. Sports drinks offer valuable electrolytes, sugars, and fluids.  Breathing heated mist or steam (vaporizer or shower).  Eating chicken soup or other clear broths, and maintaining good nutrition.  Getting plenty of rest.  Using gargles or lozenges for comfort.  Controlling fevers with ibuprofen or acetaminophen as directed by your caregiver.  Increasing usage of your inhaler if you have asthma. Zinc gel and zinc lozenges, taken in the first 24 hours of the common cold, can shorten the duration and lessen the severity of symptoms. Pain medicines may help with fever, muscle aches, and throat pain. A variety of non-prescription medicines are available to treat congestion and runny nose. Your caregiver can make recommendations and may suggest nasal or lung inhalers for other symptoms.  HOME CARE INSTRUCTIONS   Only take over-the-counter or prescription medicines for pain, discomfort, or fever as directed by your caregiver.  Use a warm mist humidifier or inhale steam from a shower to increase air moisture. This may keep secretions moist and make it easier to breathe.  Drink enough water and fluids to keep your urine clear or pale yellow.  Rest as needed.  Return to  work when your temperature has returned to normal or as your caregiver advises. You may need to stay home longer to avoid infecting others. You can also use a face mask and careful hand washing to prevent spread of the virus. SEEK MEDICAL CARE IF:   After the first few days, you feel you are getting worse rather than better.  You need your caregiver's advice about medicines to control symptoms.  You develop chills, worsening shortness of breath, or brown or red sputum. These may be signs of pneumonia.  You develop yellow or brown nasal discharge or pain in the face, especially when you bend forward. These may be signs of sinusitis.  You develop a fever, swollen neck  glands, pain with swallowing, or white areas in the back of your throat. These may be signs of strep throat. SEEK IMMEDIATE MEDICAL CARE IF:   You have a fever.  You develop severe or persistent headache, ear pain, sinus pain, or chest pain.  You develop wheezing, a prolonged cough, cough up blood, or have a change in your usual mucus (if you have chronic lung disease).  You develop sore muscles or a stiff neck. Document Released: 11/27/2000 Document Revised: 08/26/2011 Document Reviewed: 09/08/2013 [facility] Patient Information 2015 [place], [place]. This information is not intended to replace advice given to you by your health care provider. Make sure you discuss any questions you have with your health care provider.  Vaginitis Vaginitis is an inflammation of the vagina. It is most often caused by a change in the normal balance of the bacteria and yeast that live in the vagina. This change in balance causes an overgrowth of certain bacteria or yeast, which causes the inflammation. There are different types of vaginitis, but the most common types are:  Bacterial vaginosis.  Yeast infection (candidiasis).  Trichomoniasis vaginitis. This is a sexually transmitted infection (STI).  Viral vaginitis.  Atropic vaginitis.  Allergic vaginitis. CAUSES  The cause depends on the type of vaginitis. Vaginitis can be caused by:  Bacteria (bacterial vaginosis).  Yeast (yeast infection).  A parasite (trichomoniasis vaginitis)  A virus (viral vaginitis).  Low hormone levels (atrophic vaginitis). Low hormone levels can occur during pregnancy, breastfeeding, or after menopause.  Irritants, such as bubble baths, scented tampons, and feminine sprays (allergic vaginitis). Other factors can change the normal balance of the yeast and bacteria that live in the vagina. These include:  Antibiotic medicines.  Poor hygiene.  Diaphragms, vaginal sponges, spermicides, birth control pills, and  intrauterine devices (IUD).  Sexual intercourse.  Infection.  Uncontrolled diabetes.  A weakened immune system. SYMPTOMS  Symptoms can vary depending on the cause of the vaginitis. Common symptoms include:  Abnormal vaginal discharge.  The discharge is white, gray, or yellow with bacterial vaginosis.  The discharge is thick, white, and cheesy with a yeast infection.  The discharge is frothy and yellow or greenish with trichomoniasis.  A bad vaginal odor.  The odor is fishy with bacterial vaginosis.  Vaginal itching, pain, or swelling.  Painful intercourse.  Pain or burning when urinating. Sometimes, there are no symptoms. TREATMENT  Treatment will vary depending on the type of infection.   Bacterial vaginosis and trichomoniasis are often treated with antibiotic creams or pills.  Yeast infections are often treated with antifungal medicines, such as vaginal creams or suppositories.  Viral vaginitis has no cure, but symptoms can be treated with medicines that relieve discomfort. Your sexual partner should be treated as well.  Atrophic vaginitis may be treated  with an estrogen cream, pill, suppository, or vaginal ring. If vaginal dryness occurs, lubricants and moisturizing creams may help. You may be told to avoid scented soaps, sprays, or douches.  Allergic vaginitis treatment involves quitting the use of the product that is causing the problem. Vaginal creams can be used to treat the symptoms. HOME CARE INSTRUCTIONS   Take all medicines as directed by your caregiver.  Keep your genital area clean and dry. Avoid soap and only rinse the area with water.  Avoid douching. It can remove the healthy bacteria in the vagina.  Do not use tampons or have sexual intercourse until your vaginitis has been treated. Use sanitary pads while you have vaginitis.  Wipe from front to back. This avoids the spread of bacteria from the rectum to the vagina.  Let air reach your genital  area.  Wear cotton underwear to decrease moisture buildup.  Avoid wearing underwear while you sleep until your vaginitis is gone.  Avoid tight pants and underwear or nylons without a cotton panel.  Take off wet clothing (especially bathing suits) as soon as possible.  Use mild, non-scented products. Avoid using irritants, such as:  Scented feminine sprays.  Fabric softeners.  Scented detergents.  Scented tampons.  Scented soaps or bubble baths.  Practice safe sex and use condoms. Condoms may prevent the spread of trichomoniasis and viral vaginitis. SEEK MEDICAL CARE IF:   You have abdominal pain.  You have a fever or persistent symptoms for more than 2-3 days.  You have a fever and your symptoms suddenly get worse. Document Released: 03/31/2007 Document Revised: 02/26/2012 Document Reviewed: 11/14/2011 [facility] Patient Information 2015 [place], [place]. This information is not intended to replace advice given to you by your health care provider. Make sure you discuss any questions you have with your health care provider.

## 2014-11-11 LAB — CERVICOVAGINAL ANCILLARY ONLY
CHLAMYDIA, DNA PROBE: NEGATIVE
NEISSERIA GONORRHEA: NEGATIVE
Wet Prep (BD Affirm): NEGATIVE

## 2014-11-11 LAB — HIV ANTIBODY (ROUTINE TESTING W REFLEX): HIV Screen 4th Generation wRfx: NONREACTIVE

## 2014-11-11 LAB — RPR: RPR Ser Ql: NONREACTIVE

## 2014-11-18 NOTE — ED Notes (Signed)
Final lab reports reviewed . Negative for GC, chlamydia, yeast. No further action required

## 2014-12-23 ENCOUNTER — Emergency Department (INDEPENDENT_AMBULATORY_CARE_PROVIDER_SITE_OTHER)
Admission: EM | Admit: 2014-12-23 | Discharge: 2014-12-23 | Disposition: A | Discharge status: Home / Self Care | Payer: Self-pay | Source: Home / Self Care | Attending: Family Medicine | Admitting: Family Medicine

## 2014-12-23 ENCOUNTER — Emergency Department (INDEPENDENT_AMBULATORY_CARE_PROVIDER_SITE_OTHER): Payer: Self-pay

## 2014-12-23 ENCOUNTER — Encounter (HOSPITAL_COMMUNITY): Payer: Self-pay | Admitting: Emergency Medicine

## 2014-12-23 DIAGNOSIS — S93402A Sprain of unspecified ligament of left ankle, initial encounter: Secondary | ICD-10-CM

## 2014-12-23 HISTORY — DX: Unspecified injury of unspecified ankle, initial encounter: S99.919A

## 2014-12-23 NOTE — Discharge Instructions (Signed)
Ankle Sprain An ankle sprain is an injury to the strong, fibrous tissues (ligaments) that hold your ankle bones together.  HOME CARE   Put ice on your ankle for 1-2 days or as told by your doctor.  Put ice in a plastic bag.  Place a towel between your skin and the bag.  Leave the ice on for 15-20 minutes at a time, every 2 hours while you are awake.  Only take medicine as told by your doctor.  Raise (elevate) your injured ankle above the level of your heart as much as possible for 2-3 days.  Use crutches if your doctor tells you to. Slowly put your own weight on the affected ankle. Use the crutches until you can walk without pain.  If you have a plaster splint:  Do not rest it on anything harder than a pillow for 24 hours.  Do not put weight on it.  Do not get it wet.  Take it off to shower or bathe.  If given, use an elastic wrap or support stocking for support. Take the wrap off if your toes lose feeling (numb), tingle, or turn cold or blue.  If you have an air splint:  Add or let out air to make it comfortable.  Take it off at night and to shower and bathe.  Wiggle your toes and move your ankle up and down often while you are wearing it. GET HELP IF:  You have rapidly increasing bruising or puffiness (swelling).  Your toes feel very cold.  You lose feeling in your foot.  Your medicine does not help your pain. GET HELP RIGHT AWAY IF:   Your toes lose feeling (numb) or turn blue.  You have severe pain that is increasing. MAKE SURE YOU:   Understand these instructions.  Will watch your condition.  Will get help right away if you are not doing well or get worse. Document Released: 11/20/2007 Document Revised: 10/18/2013 Document Reviewed: 12/16/2011 [facility] Patient Information 2015 [place], [place]. This information is not intended to replace advice given to you by your health care provider. Make sure you discuss any questions you have with your health care  provider.  Foot Sprain The muscles and cord like structures which attach muscle to bone (tendons) that surround the feet are made up of units. A foot sprain can occur at the weakest spot in any of these units. This condition is most often caused by injury to or overuse of the foot, as from playing contact sports, or aggravating a previous injury, or from poor conditioning, or obesity. SYMPTOMS  Pain with movement of the foot.  Tenderness and swelling at the injury site.  Loss of strength is present in moderate or severe sprains. THE THREE GRADES OR SEVERITY OF FOOT SPRAIN ARE:  Mild (Grade I): Slightly pulled muscle without tearing of muscle or tendon fibers or loss of strength.  Moderate (Grade II): Tearing of fibers in a muscle, tendon, or at the attachment to bone, with small decrease in strength.  Severe (Grade III): Rupture of the muscle-tendon-bone attachment, with separation of fibers. Severe sprain requires surgical repair. Often repeating (chronic) sprains are caused by overuse. Sudden (acute) sprains are caused by direct injury or over-use. DIAGNOSIS  Diagnosis of this condition is usually by your own observation. If problems continue, a caregiver may be required for further evaluation and treatment. X-rays may be required to make sure there are not breaks in the bones (fractures) present. Continued problems may require physical therapy  for treatment. PREVENTION  Use strength and conditioning exercises appropriate for your sport.  Warm up properly prior to working out.  Use athletic shoes that are made for the sport you are participating in.  Allow adequate time for healing. Early return to activities makes repeat injury more likely, and can lead to an unstable arthritic foot that can result in prolonged disability. Mild sprains generally heal in 3 to 10 days, with moderate and severe sprains taking 2 to 10 weeks. Your caregiver can help you determine the proper time required  for healing. HOME CARE INSTRUCTIONS   Apply ice to the injury for 15-20 minutes, 03-04 times per day. Put the ice in a plastic bag and place a towel between the bag of ice and your skin.  An elastic wrap (like an Ace bandage) may be used to keep swelling down.  Keep foot above the level of the heart, or at least raised on a footstool, when swelling and pain are present.  Try to avoid use other than gentle range of motion while the foot is painful. Do not resume use until instructed by your caregiver. Then begin use gradually, not increasing use to the point of pain. If pain does develop, decrease use and continue the above measures, gradually increasing activities that do not cause discomfort, until you gradually achieve normal use.  Use crutches if and as instructed, and for the length of time instructed.  Keep injured foot and ankle wrapped between treatments.  Massage foot and ankle for comfort and to keep swelling down. Massage from the toes up towards the knee.  Only take over-the-counter or prescription medicines for pain, discomfort, or fever as directed by your caregiver. SEEK IMMEDIATE MEDICAL CARE IF:   Your pain and swelling increase, or pain is not controlled with medications.  You have loss of feeling in your foot or your foot turns cold or blue.  You develop new, unexplained symptoms, or an increase of the symptoms that brought you to your caregiver. MAKE SURE YOU:   Understand these instructions.  Will watch your condition.  Will get help right away if you are not doing well or get worse. Document Released: 11/23/2001 Document Revised: 08/26/2011 Document Reviewed: 01/21/2008 [facility] Patient Information 2015 [place], [place]. This information is not intended to replace advice given to you by your health care provider. Make sure you discuss any questions you have with your health care provider.
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
## 2014-12-23 NOTE — ED Notes (Signed)
Patient reports a history with this foot and currently participating in physical therapy.  Patient reports this foot has altered sensation since original injury.  Reports waking this morning, went to stand and with baseline numbness, patient fell.  Patient complains of left knee, left ankle and foot.  Swelling to left lateral ankle, small abrasion under left knee.

## 2015-02-25 ENCOUNTER — Inpatient Hospital Stay (HOSPITAL_COMMUNITY): Payer: Self-pay

## 2015-02-25 ENCOUNTER — Inpatient Hospital Stay (HOSPITAL_COMMUNITY)
Admission: AD | Admit: 2015-02-25 | Discharge: 2015-02-25 | Disposition: A | Discharge status: Home / Self Care | Payer: Self-pay | Source: Ambulatory Visit | Attending: Obstetrics & Gynecology | Admitting: Obstetrics & Gynecology

## 2015-02-25 ENCOUNTER — Encounter (HOSPITAL_COMMUNITY): Payer: Self-pay | Admitting: *Deleted

## 2015-02-25 DIAGNOSIS — Z87891 Personal history of nicotine dependence: Secondary | ICD-10-CM | POA: Insufficient documentation

## 2015-02-25 DIAGNOSIS — N760 Acute vaginitis: Secondary | ICD-10-CM | POA: Insufficient documentation

## 2015-02-25 DIAGNOSIS — R109 Unspecified abdominal pain: Secondary | ICD-10-CM

## 2015-02-25 DIAGNOSIS — A499 Bacterial infection, unspecified: Secondary | ICD-10-CM

## 2015-02-25 DIAGNOSIS — O26899 Other specified pregnancy related conditions, unspecified trimester: Secondary | ICD-10-CM

## 2015-02-25 DIAGNOSIS — O30001 Twin pregnancy, unspecified number of placenta and unspecified number of amniotic sacs, first trimester: Secondary | ICD-10-CM | POA: Insufficient documentation

## 2015-02-25 DIAGNOSIS — Z3A01 Less than 8 weeks gestation of pregnancy: Secondary | ICD-10-CM | POA: Insufficient documentation

## 2015-02-25 DIAGNOSIS — O23591 Infection of other part of genital tract in pregnancy, first trimester: Secondary | ICD-10-CM | POA: Insufficient documentation

## 2015-02-25 DIAGNOSIS — B9689 Other specified bacterial agents as the cause of diseases classified elsewhere: Secondary | ICD-10-CM | POA: Insufficient documentation

## 2015-02-25 DIAGNOSIS — O210 Mild hyperemesis gravidarum: Secondary | ICD-10-CM | POA: Insufficient documentation

## 2015-02-25 LAB — URINALYSIS, ROUTINE W REFLEX MICROSCOPIC
BILIRUBIN URINE: NEGATIVE
Glucose, UA: NEGATIVE mg/dL
Ketones, ur: NEGATIVE mg/dL
LEUKOCYTES UA: NEGATIVE
NITRITE: NEGATIVE
PH: 6.5 (ref 5.0–8.0)
PROTEIN: NEGATIVE mg/dL
Specific Gravity, Urine: 1.02 (ref 1.005–1.030)
UROBILINOGEN UA: 0.2 mg/dL (ref 0.0–1.0)

## 2015-02-25 LAB — CBC
HCT: 30.8 % — ABNORMAL LOW (ref 36.0–46.0)
HEMOGLOBIN: 10 g/dL — AB (ref 12.0–15.0)
MCH: 26.2 pg (ref 26.0–34.0)
MCHC: 32.5 g/dL (ref 30.0–36.0)
MCV: 80.8 fL (ref 78.0–100.0)
PLATELETS: 300 10*3/uL (ref 150–400)
RBC: 3.81 MIL/uL — ABNORMAL LOW (ref 3.87–5.11)
RDW: 16.3 % — ABNORMAL HIGH (ref 11.5–15.5)
WBC: 12 10*3/uL — AB (ref 4.0–10.5)

## 2015-02-25 LAB — WET PREP, GENITAL
TRICH WET PREP: NONE SEEN
YEAST WET PREP: NONE SEEN

## 2015-02-25 LAB — URINE MICROSCOPIC-ADD ON

## 2015-02-25 LAB — POCT PREGNANCY, URINE: Preg Test, Ur: POSITIVE — AB

## 2015-02-25 LAB — HCG, QUANTITATIVE, PREGNANCY: hCG, Beta Chain, Quant, S: 69651 m[IU]/mL — ABNORMAL HIGH (ref <5)

## 2015-02-25 MED ORDER — PROMETHAZINE HCL 25 MG PO TABS: 25.0000 mg | ORAL_TABLET | Freq: Four times a day (QID) | ORAL | Status: DC | PRN

## 2015-02-25 MED ORDER — TERCONAZOLE 0.4 % VA CREA: 1.0000 | TOPICAL_CREAM | Freq: Every day | VAGINAL | Status: DC

## 2015-02-25 MED ORDER — METRONIDAZOLE 500 MG PO TABS: 500.0000 mg | ORAL_TABLET | Freq: Two times a day (BID) | ORAL | Status: DC

## 2015-02-25 NOTE — MAU Note (Signed)
Patient presents to MAU with c/o abdominal pain and vaginal discharge.  She states her LMP was 01/17/15 and had a +HPT.  She has been nauseated for past two weeks and abdominal pain is 8/10. Vaginal discharge is white and pt also c/o vaginal soreness.

## 2015-02-25 NOTE — MAU Provider Note (Signed)
History     CSN: [id number]  Arrival date and time: 02/25/15 1338   First Provider Initiated Contact with Patient 02/25/15 1411      Chief Complaint  Patient presents with  . Abdominal Pain  . Vaginal Discharge   HPI Tender breasts and back pain about 2 weeks ago.l  Pt has been nauseated and vomiting for 2 day along with diarrhea- every time she she eats- watery. Pt has abd cramping with diarrhea.- pt's pain is 6/7 out of 10. Pt was not using anything for contraception. Pt has been with this partner for 2months.   Pt has vaginal discharge that is white vaginal discharge.  Rn note: Signed  MAU Note 02/25/2015 2:00 PM    Expand All Collapse All   Patient presents to MAU with c/o abdominal pain and vaginal discharge. She states her LMP was 01/17/15 and had a +HPT. She has been nauseated for past two weeks and abdominal pain is 8/10. Vaginal discharge is white and pt also c/o vaginal soreness.         Past Medical History  Diagnosis Date  . Depression   . Ankle injury     Past Surgical History  Procedure Laterality Date  . Dilation and curettage of uterus      Family History  Problem Relation Age of Onset  . Anesthesia problems Neg Hx   . Hypotension Neg Hx   . Malignant hyperthermia Neg Hx   . Pseudochol deficiency Neg Hx   . Diabetes Mother   . Hypertension Mother   . Hypertension Father   . Asthma Sister     Social History  Substance Use Topics  . Smoking status: Former Smoker    Types: Cigars    Quit date: 08/23/2011  . Smokeless tobacco: Never Used  . Alcohol Use: Yes     Comment: occ    Allergies: No Known Allergies  Prescriptions prior to admission  Medication Sig Dispense Refill Last Dose  . naproxen (NAPROSYN) 375 MG tablet Take 1 tablet (375 mg total) by mouth 2 (two) times daily. (Patient not taking: Reported on 02/25/2015) 20 tablet 0 Unknown at Unknown time    Review of Systems  Constitutional: Negative for fever and chills.   Gastrointestinal: Positive for heartburn, nausea, vomiting, abdominal pain and diarrhea. Negative for constipation.  Genitourinary: Negative for dysuria.  Musculoskeletal: Positive for back pain.  Neurological: Negative for headaches.   Physical Exam   Blood pressure 102/63, pulse 92, temperature 98.6 F (37 C), temperature source Oral, resp. rate 16, height 5' 3.5" (1.613 m), weight 189 lb (85.73 kg), last menstrual period 01/17/2015, SpO2 100 %.  Physical Exam  Nursing note and vitals reviewed. Constitutional: She is oriented to person, place, and time. She appears well-developed and well-nourished. No distress.  HENT:  Head: Normocephalic.  Eyes: Pupils are equal, round, and reactive to light.  Neck: Normal range of motion. Neck supple.  Cardiovascular: Normal rate.   Respiratory: Effort normal.  GI: Soft. She exhibits no distension. There is no tenderness. There is no rebound and no guarding.  Genitourinary:  Mod amount of frothy white discharge in vault; cervix mildly tender with exam; uterus retroverted- difficult to outline due to habitus; no appreciable enlargement or tenderness of adnexa  Musculoskeletal: Normal range of motion.  Neurological: She is alert and oriented to person, place, and time.  Skin: Skin is warm.  Psychiatric: She has a normal mood and affect.    MAU Course  Procedures Results for  orders placed or performed during the hospital encounter of 02/25/15 (from the past 24 hour(s))  Urinalysis, Routine w reflex microscopic (not at [facility])     Status: Abnormal   Collection Time: 02/25/15  1:50 PM  Result Value Ref Range   Color, Urine YELLOW YELLOW   APPearance CLEAR CLEAR   Specific Gravity, Urine 1.020 1.005 - 1.030   pH 6.5 5.0 - 8.0   Glucose, UA NEGATIVE NEGATIVE mg/dL   Hgb urine dipstick TRACE (A) NEGATIVE   Bilirubin Urine NEGATIVE NEGATIVE   Ketones, ur NEGATIVE NEGATIVE mg/dL   Protein, ur NEGATIVE NEGATIVE mg/dL   Urobilinogen, UA 0.2 0.0 -  1.0 mg/dL   Nitrite NEGATIVE NEGATIVE   Leukocytes, UA NEGATIVE NEGATIVE  Urine microscopic-add on     Status: Abnormal   Collection Time: 02/25/15  1:50 PM  Result Value Ref Range   Squamous Epithelial / LPF FEW (A) RARE   WBC, UA 0-2 <3 WBC/hpf   RBC / HPF 0-2 <3 RBC/hpf   Urine-Other MUCOUS PRESENT   Pregnancy, urine POC     Status: Abnormal   Collection Time: 02/25/15  2:27 PM  Result Value Ref Range   Preg Test, Ur POSITIVE (A) NEGATIVE  CBC     Status: Abnormal   Collection Time: 02/25/15  2:30 PM  Result Value Ref Range   WBC 12.0 (H) 4.0 - 10.5 K/uL   RBC 3.81 (L) 3.87 - 5.11 MIL/uL   Hemoglobin 10.0 (L) 12.0 - 15.0 g/dL   HCT 45.4 (L) 09.8 - 11.9 %   MCV 80.8 78.0 - 100.0 fL   MCH 26.2 26.0 - 34.0 pg   MCHC 32.5 30.0 - 36.0 g/dL   RDW 14.7 (H) 82.9 - 56.2 %   Platelets 300 150 - 400 K/uL  hCG, quantitative, pregnancy     Status: Abnormal   Collection Time: 02/25/15  2:30 PM  Result Value Ref Range   hCG, Beta Chain, Quant, S 13086 (H) <5 mIU/mL  Wet prep, genital     Status: Abnormal   Collection Time: 02/25/15  2:45 PM  Result Value Ref Range   Yeast Wet Prep HPF POC NONE SEEN NONE SEEN   Trich, Wet Prep NONE SEEN NONE SEEN   Clue Cells Wet Prep HPF POC MODERATE (A) NONE SEEN   WBC, Wet Prep HPF POC FEW (A) NONE SEEN  US Ob Comp Less 14 Wks  02/25/2015   CLINICAL DATA:  First trimester of pregnancy, generalized abdominal pain.  EXAM: TWIN OBSTETRIC <14WK [place] AND TRANSVAGINAL OB US  COMPARISON:  None.  FINDINGS: TWIN 1  Intrauterine gestational sac: Visualized/normal in shape.  Yolk sac:  Visualized.  Embryo:  Visualized.  Cardiac Activity: Visualized.  Heart Rate: 133 bpm  CRL:  6.8  mm   6 w 4 d                  [place] EDC: Oct 17, 2015.  TWIN 2  Intrauterine gestational sac: Visualized/normal in shape.  Yolk sac:  Visualized.  Embryo:  Visualized.  Cardiac Activity: Visualized.  Heart Rate: 121 bpm  CRL:  6.4  mm   6 w 4 d                  [place] EDC: Oct 17, 2015.  Maternal  uterus/adnexae: Small subchronic hemorrhage is noted. Small amount of free fluid is noted. Ovaries appear normal.  IMPRESSION: Twin live intrauterine gestations corresponding to age of 6 weeks 4  days. Small subchorionic hemorrhage is noted.   Electronically Signed   By: Lupita Raider, M.D.   On: 02/25/2015 16:02   US Ob Transvaginal  02/25/2015   CLINICAL DATA:  First trimester of pregnancy, generalized abdominal pain.  EXAM: TWIN OBSTETRIC <14WK [place] AND TRANSVAGINAL OB US  COMPARISON:  None.  FINDINGS: TWIN 1  Intrauterine gestational sac: Visualized/normal in shape.  Yolk sac:  Visualized.  Embryo:  Visualized.  Cardiac Activity: Visualized.  Heart Rate: 133 bpm  CRL:  6.8  mm   6 w 4 d                  [place] EDC: Oct 17, 2015.  TWIN 2  Intrauterine gestational sac: Visualized/normal in shape.  Yolk sac:  Visualized.  Embryo:  Visualized.  Cardiac Activity: Visualized.  Heart Rate: 121 bpm  CRL:  6.4  mm   6 w 4 d                  [place] EDC: Oct 17, 2015.  Maternal uterus/adnexae: Small subchronic hemorrhage is noted. Small amount of free fluid is noted. Ovaries appear normal.  IMPRESSION: Twin live intrauterine gestations corresponding to age of 6 weeks 4 days. Small subchorionic hemorrhage is noted.   Electronically Signed   By: Lupita Raider, M.D.   On: 02/25/2015 16:02    Assessment and Plan  Twin live pregnancy- [redacted]w[redacted]d Morning sickness- Rx Phenergan bv- flagy also Rx for terazol (per request of pt) F/u with prenatal care- pt will go to [facility] Prenatal vitamins List of safe pregnancy meds given Michiah Mudry 02/25/2015, 2:12 PM

## 2015-02-25 NOTE — Discharge Instructions (Signed)
Safe Medications in Pregnancy  ° °Acne: °Benzoyl Peroxide °Salicylic Acid ° °Backache/Headache: °Tylenol: 2 regular strength every 4 hours OR °             2 Extra strength every 6 hours ° °Colds/Coughs/Allergies: °Benadryl (alcohol free) 25 mg every 6 hours as needed °Breath right strips °Claritin °Cepacol throat lozenges °Chloraseptic throat spray °Cold-Eeze- up to three times per day °Cough drops, alcohol free °Flonase (by prescription only) °Guaifenesin °Mucinex °Robitussin DM (plain only, alcohol free) °Saline nasal spray/drops °Sudafed (pseudoephedrine) & Actifed ** use only after [redacted] weeks gestation and if you do not have high blood pressure °Tylenol °Vicks Vaporub °Zinc lozenges °Zyrtec  ° °Constipation: °Colace °Ducolax suppositories °Fleet enema °Glycerin suppositories °Metamucil °Milk of magnesia °Miralax °Senokot °Smooth move tea ° °Diarrhea: °Kaopectate °Imodium A-D ° °*NO pepto Bismol ° °Hemorrhoids: °Anusol °Anusol HC °Preparation H °Tucks ° °Indigestion: °Tums °Maalox °Mylanta °Zantac  °Pepcid ° °Insomnia: °Benadryl (alcohol free) 25mg every 6 hours as needed °Tylenol PM °Unisom, no Gelcaps ° °Leg Cramps: °Tums °MagGel ° °Nausea/Vomiting:  °Bonine °Dramamine °Emetrol °Ginger extract °Sea bands °Meclizine  °Nausea medication to take during pregnancy:  °Unisom (doxylamine succinate 25 mg tablets) Take one tablet daily at bedtime. If symptoms are not adequately controlled, the dose can be increased to a maximum recommended dose of two tablets daily (1/2 tablet in the morning, 1/2 tablet mid-afternoon and one at bedtime). °Vitamin B6 100mg tablets. Take one tablet twice a day (up to 200 mg per day). ° °Skin Rashes: °Aveeno products °Benadryl cream or 25mg every 6 hours as needed °Calamine Lotion °1% cortisone cream ° °Yeast infection: °Gyne-lotrimin 7 °Monistat 7 ° ° °**If taking multiple medications, please check labels to avoid duplicating the same active ingredients °**take medication as directed on  the label °** Do not exceed 4000 mg of tylenol in 24 hours °**Do not take medications that contain aspirin or ibuprofen ° °  ________________________________________ ° ° ° ° °To schedule your Maternity Eligibility Appointment, please call [phone].  When you arrive for your appointment you must bring the following items or information listed below.  Your appointment will be rescheduled if you do not have these items or are 15 minutes late. °If currently receiving Medicaid, you MUST bring: °1. Medicaid Card °2. Social Security Card °3. Picture ID °4. Proof of Pregnancy °5. Verification of current address if the address on Medicaid card is incorrect "postmarked mail" °If not receiving Medicaid, you MUST bring: °1. Social Security Card °2. Picture ID °3. Birth Certificate (if available) Passport or *Green Card °4. Proof of Pregnancy °5. Verification of current address "postmarked mail" for each income presented. °6. Verification of insurance coverage, if any °7. Check stubs from each employer for the previous month (if unable to present check stub  °for each week, we will accept check stub for the first and last week ill the same month.) If you can't locate check stubs, you must bring a letter from the employer(s) and it must have the following information on letterhead, typed, in English: °o name of company °o company telephone number °o how long been with the company, if less than one month °o how much person earns per hour °o how many hours per week work °o the gross pay the person earned for the previous month °If you are 27 years old or less, you do not have to bring proof of income unless you work or live with the father of the baby and at that time we   will need proof of income from you and/or the father of the baby. °Green Card recipients are eligible for Medicaid for Pregnant Women (MPW) ° ° ° °

## 2015-02-26 LAB — RPR: RPR Ser Ql: NONREACTIVE

## 2015-02-26 LAB — HIV ANTIBODY (ROUTINE TESTING W REFLEX): HIV Screen 4th Generation wRfx: NONREACTIVE

## 2015-02-27 LAB — GC/CHLAMYDIA PROBE AMP (~~LOC~~) NOT AT ARMC
Chlamydia: NEGATIVE
NEISSERIA GONORRHEA: NEGATIVE
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
## 2015-12-31 ENCOUNTER — Encounter (HOSPITAL_COMMUNITY): Payer: Self-pay | Admitting: *Deleted

## 2017-03-09 ENCOUNTER — Encounter (HOSPITAL_COMMUNITY): Payer: Self-pay

## 2017-03-09 ENCOUNTER — Inpatient Hospital Stay (HOSPITAL_COMMUNITY)
Admission: AD | Admit: 2017-03-09 | Discharge: 2017-03-09 | Disposition: A | Discharge status: Home / Self Care | Payer: Self-pay | Source: Ambulatory Visit | Attending: Obstetrics and Gynecology | Admitting: Obstetrics and Gynecology

## 2017-03-09 DIAGNOSIS — J069 Acute upper respiratory infection, unspecified: Secondary | ICD-10-CM | POA: Insufficient documentation

## 2017-03-09 DIAGNOSIS — R110 Nausea: Secondary | ICD-10-CM | POA: Insufficient documentation

## 2017-03-09 DIAGNOSIS — Z87891 Personal history of nicotine dependence: Secondary | ICD-10-CM | POA: Insufficient documentation

## 2017-03-09 DIAGNOSIS — R12 Heartburn: Secondary | ICD-10-CM | POA: Insufficient documentation

## 2017-03-09 LAB — URINALYSIS, ROUTINE W REFLEX MICROSCOPIC
BILIRUBIN URINE: NEGATIVE
GLUCOSE, UA: NEGATIVE mg/dL
Ketones, ur: NEGATIVE mg/dL
LEUKOCYTES UA: NEGATIVE
NITRITE: NEGATIVE
Protein, ur: NEGATIVE mg/dL
Specific Gravity, Urine: 1.019 (ref 1.005–1.030)
pH: 7 (ref 5.0–8.0)

## 2017-03-09 LAB — INFLUENZA PANEL BY PCR (TYPE A & B)
INFLBPCR: NEGATIVE
Influenza A By PCR: NEGATIVE

## 2017-03-09 LAB — POCT PREGNANCY, URINE: Preg Test, Ur: NEGATIVE

## 2017-03-09 MED ORDER — BENZONATATE 100 MG PO CAPS: 100.0000 mg | ORAL_CAPSULE | Freq: Three times a day (TID) | ORAL | 0 refills | Status: AC

## 2017-03-09 MED ORDER — GI COCKTAIL ~~LOC~~
30.0000 mL | Freq: Once | ORAL | Status: AC
  Administered 2017-03-09: 30 mL via ORAL
  Filled 2017-03-09: qty 30

## 2017-03-09 NOTE — MAU Note (Signed)
Felt like running fever on Friday, congestion, body aches, bilateral lower edema, no period in two months.

## 2017-03-09 NOTE — Discharge Instructions (Signed)
In late 2019, the [facility] will be moving to the [place] campus. At that time, the MAU will no longer serve non-pregnant patients. We encourage you to establish care with a provider before that time, so that you can be seen with any GYN concerns, like vaginal discharge, urinary tract infection, etc.. in a timely manner. In order to make the office visit more convenient, the [facility] will be offering evening hours from 4pm-8pm on Mondays starting 02/24/17. There will be same-day appointments, walk-in appointments and scheduled appointments available during this time.   ° °[facility] - [phone] ° °For urgent needs, [facility] is also available for management of urgent GYN complaints such as vaginal discharge.  ° °[employer] extends eligibility for family planning services to reduce unintended pregnancies and improve the well-being of children and families. ° ° Eligible individuals whose income is at or below 195% of the federal poverty level and who are:  °- [place] citizens, documented immigrants or qualified aliens;  °- Residents of [place];  °- Not incarcerated; and  °- Not pregnant.  ° °[employer] Contact Information:  °Medical Assistance Clinical Section °Phone: [phone] °Email: dma.besmart@[email or web address].gov  ° ° ° °

## 2017-03-09 NOTE — MAU Note (Signed)
Pt presents to MAU c/o flu like symptoms, runny nose, N&V, abdominal pain, GERD and swelling in legs.  Pt reports no period for 2 months and is s/p Depo injection in June.  Pt states she is "achey" all over.  Pregnancy test is neg.

## 2017-03-09 NOTE — MAU Provider Note (Signed)
History     CSN: [id number]  Arrival date and time: 03/09/17 1210   First Provider Initiated Contact with Patient 03/09/17 1322      Chief Complaint  Patient presents with  . Amenorrhea  . Abdominal Pain  . Generalized Body Aches  . Leg Swelling   HPI Lauren Hayden 31 y.o. Has been sick for a couple of days.  Young child at home being treated for flu.  Having heartburn, body aches, no menses in 3 months but has had one Depo injection in June 2018.  Is not having sex since July 2018 and states she is not planning to have sex.  Has a nonproductive cough.  Is worried that she has the flu as she feels so bad.   OB History    Gravida Para Term Preterm AB Living   SAB TAB Ectopic Multiple Live Births   Past Medical History:  Diagnosis Date  . Ankle injury   . Depression     Past Surgical History:  Procedure Laterality Date  . DILATION AND CURETTAGE OF UTERUS      Family History  Problem Relation Age of Onset  . Anesthesia problems Neg Hx   . Hypotension Neg Hx   . Malignant hyperthermia Neg Hx   . Pseudochol deficiency Neg Hx   . Diabetes Mother   . Hypertension Mother   . Hypertension Father   . Asthma Sister     Social History  Substance Use Topics  . Smoking status: Former Smoker    Types: Cigars    Quit date: 08/23/2011  . Smokeless tobacco: Never Used  . Alcohol use Yes     Comment: occ    Allergies: No Known Allergies  Prescriptions Prior to Admission  Medication Sig Dispense Refill Last Dose  . acetaminophen (TYLENOL) 500 MG tablet Take 500 mg by mouth every 6 (six) hours as needed for moderate pain, fever or headache.   03/08/2017 at Unknown time  . ibuprofen (ADVIL,MOTRIN) 200 MG tablet Take 200 mg by mouth every 6 (six) hours as needed for fever, headache or moderate pain.   03/09/2017 at Unknown time  . Multiple Vitamins-Minerals (MULTIVITAMIN ADULT PO) Take 1 tablet by mouth daily.   Past Week at Unknown time  .  terconazole (TERAZOL 7) 0.4 % vaginal cream Place 1 applicator vaginally at bedtime. 45 g 0     Review of Systems  Constitutional: Negative for fever.  Respiratory: Positive for cough. Negative for shortness of breath and wheezing.   Cardiovascular: Positive for leg swelling.  Gastrointestinal: Positive for nausea. Negative for abdominal pain, constipation, diarrhea and vomiting.  Genitourinary: Negative for vaginal bleeding and vaginal discharge.   Physical Exam   Blood pressure 113/72, pulse 94, temperature 98.2 F (36.8 C), temperature source Oral, resp. rate 18, unknown if currently breastfeeding.  Physical Exam  Nursing note and vitals reviewed. Constitutional: She is oriented to person, place, and time. She appears well-developed and well-nourished.  HENT:  Head: Normocephalic.  Mouth/Throat: No oropharyngeal exudate.  Large tonsils mildly pink  Eyes: EOM are normal. Right eye exhibits no discharge. Left eye exhibits no discharge.  Neck: Neck supple.  Cardiovascular: Normal rate.   Respiratory: Effort normal and breath sounds normal. She has no wheezes.  Musculoskeletal: Normal range of motion. She exhibits no edema.  Neurological: She is alert and oriented to person, place, and  time.  Skin: Skin is warm and dry.  Psychiatric: She has a normal mood and affect.    MAU Course  Procedures Results for orders placed or performed during the hospital encounter of 03/09/17 (from the past 24 hour(s))  Urinalysis, Routine w reflex microscopic     Status: Abnormal   Collection Time: 03/09/17 12:20 PM  Result Value Ref Range   Color, Urine YELLOW YELLOW   APPearance CLEAR CLEAR   Specific Gravity, Urine 1.019 1.005 - 1.030   pH 7.0 5.0 - 8.0   Glucose, UA NEGATIVE NEGATIVE mg/dL   Hgb urine dipstick MODERATE (A) NEGATIVE   Bilirubin Urine NEGATIVE NEGATIVE   Ketones, ur NEGATIVE NEGATIVE mg/dL   Protein, ur NEGATIVE NEGATIVE mg/dL   Nitrite NEGATIVE NEGATIVE   Leukocytes, UA  NEGATIVE NEGATIVE   RBC / HPF 6-30 0 - 5 RBC/hpf   WBC, UA 0-5 0 - 5 WBC/hpf   Bacteria, UA RARE (A) NONE SEEN   Squamous Epithelial / LPF 0-5 (A) NONE SEEN   Mucus PRESENT   Pregnancy, urine POC     Status: None   Collection Time: 03/09/17 12:30 PM  Result Value Ref Range   Preg Test, Ur NEGATIVE NEGATIVE  Influenza panel by PCR (type A & B)     Status: None   Collection Time: 03/09/17 12:45 PM  Result Value Ref Range   Influenza A By PCR NEGATIVE NEGATIVE   Influenza B By PCR NEGATIVE NEGATIVE    MDM Requests only liquid medication today - declines Zofran.  GI cocktail given for heartburn - has not taken the medication she has at home for heartburn today.  Assessment and Plan  Upper respiratory Infection - not Flu Nausea Body aches Heartburn  Plan Your flu test is negative today - this is likely a viral illness. Drink at least 8 8-oz glasses of water every day. Continue to alternate Tylenol and Motrin as you have been doing. Continue to take Mucinex as you have been doing. If your cough worsens, there is Tessalon at your pharmacy which you can swallow whole. Rest in bed for at least a couple of days. If you have swelling in your legs, use elastic support knee high hose when you are on your feet.  Terri L Burleson 03/09/2017, 1:38 PM

## 2018-10-06 ENCOUNTER — Other Ambulatory Visit: Payer: Self-pay

## 2018-10-06 ENCOUNTER — Emergency Department (HOSPITAL_COMMUNITY)
Admission: EM | Admit: 2018-10-06 | Discharge: 2018-10-06 | Disposition: A | Discharge status: Home / Self Care | Payer: Self-pay | Attending: Emergency Medicine | Admitting: Emergency Medicine

## 2018-10-06 DIAGNOSIS — N739 Female pelvic inflammatory disease, unspecified: Secondary | ICD-10-CM | POA: Insufficient documentation

## 2018-10-06 DIAGNOSIS — N730 Acute parametritis and pelvic cellulitis: Secondary | ICD-10-CM

## 2018-10-06 DIAGNOSIS — N760 Acute vaginitis: Secondary | ICD-10-CM | POA: Insufficient documentation

## 2018-10-06 DIAGNOSIS — Z79899 Other long term (current) drug therapy: Secondary | ICD-10-CM | POA: Insufficient documentation

## 2018-10-06 DIAGNOSIS — B9689 Other specified bacterial agents as the cause of diseases classified elsewhere: Secondary | ICD-10-CM

## 2018-10-06 DIAGNOSIS — Z87891 Personal history of nicotine dependence: Secondary | ICD-10-CM | POA: Insufficient documentation

## 2018-10-06 DIAGNOSIS — F329 Major depressive disorder, single episode, unspecified: Secondary | ICD-10-CM | POA: Insufficient documentation

## 2018-10-06 LAB — URINALYSIS, ROUTINE W REFLEX MICROSCOPIC
Bilirubin Urine: NEGATIVE
Glucose, UA: NEGATIVE mg/dL
Ketones, ur: NEGATIVE mg/dL
Nitrite: NEGATIVE
Protein, ur: 30 mg/dL — AB
Specific Gravity, Urine: 1.031 — ABNORMAL HIGH (ref 1.005–1.030)
WBC, UA: >50 WBC/hpf — ABNORMAL HIGH (ref 0–5)
pH: 5 (ref 5.0–8.0)

## 2018-10-06 LAB — WET PREP, GENITAL
Sperm: NONE SEEN
Trich, Wet Prep: NONE SEEN
Yeast Wet Prep HPF POC: NONE SEEN

## 2018-10-06 LAB — I-STAT BETA HCG BLOOD, ED (MC, WL, AP ONLY): I-stat hCG, quantitative: <5 m[IU]/mL (ref <5)

## 2018-10-06 MED ORDER — CEFTRIAXONE SODIUM 250 MG IJ SOLR
250.0000 mg | Freq: Once | INTRAMUSCULAR | Status: AC
  Administered 2018-10-06: 19:00:00 250 mg via INTRAMUSCULAR
  Filled 2018-10-06: qty 250

## 2018-10-06 MED ORDER — DOXYCYCLINE HYCLATE 100 MG PO CAPS: 100.0000 mg | ORAL_CAPSULE | Freq: Two times a day (BID) | ORAL | 0 refills | Status: AC

## 2018-10-06 MED ORDER — METRONIDAZOLE 500 MG PO TABS: 500.0000 mg | ORAL_TABLET | Freq: Two times a day (BID) | ORAL | 0 refills | Status: AC

## 2018-10-06 MED ORDER — STERILE WATER FOR INJECTION IJ SOLN
INTRAMUSCULAR | Status: AC
  Administered 2018-10-06: 19:00:00 2 mL
  Filled 2018-10-06: qty 10

## 2018-10-06 NOTE — ED Notes (Signed)
Sent urine culture with specimen 

## 2018-10-06 NOTE — Discharge Instructions (Signed)
Do not mix alcohol with your antibiotics.
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
## 2018-10-07 LAB — GC/CHLAMYDIA PROBE AMP (~~LOC~~) NOT AT ARMC
Chlamydia: POSITIVE — AB
Neisseria Gonorrhea: NEGATIVE

## 2019-03-18 ENCOUNTER — Ambulatory Visit (HOSPITAL_COMMUNITY)
Admission: EM | Admit: 2019-03-18 | Discharge: 2019-03-18 | Disposition: A | Discharge status: Home / Self Care | Payer: Self-pay | Attending: Family Medicine | Admitting: Family Medicine

## 2019-03-18 ENCOUNTER — Other Ambulatory Visit: Payer: Self-pay

## 2019-03-18 ENCOUNTER — Encounter (HOSPITAL_COMMUNITY): Payer: Self-pay

## 2019-03-18 DIAGNOSIS — M62830 Muscle spasm of back: Secondary | ICD-10-CM

## 2019-03-18 DIAGNOSIS — S161XXA Strain of muscle, fascia and tendon at neck level, initial encounter: Secondary | ICD-10-CM

## 2019-03-18 MED ORDER — NAPROXEN 500 MG PO TABS: 500.0000 mg | ORAL_TABLET | Freq: Two times a day (BID) | ORAL | 0 refills | Status: AC

## 2019-03-18 MED ORDER — CYCLOBENZAPRINE HCL 10 MG PO TABS: ORAL_TABLET | ORAL | 0 refills | Status: DC
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
## 2019-07-25 ENCOUNTER — Other Ambulatory Visit: Payer: Self-pay

## 2019-07-25 ENCOUNTER — Emergency Department (HOSPITAL_COMMUNITY): Payer: No Typology Code available for payment source

## 2019-07-25 ENCOUNTER — Encounter (HOSPITAL_COMMUNITY): Payer: Self-pay | Admitting: Emergency Medicine

## 2019-07-25 ENCOUNTER — Emergency Department (HOSPITAL_COMMUNITY)
Admission: EM | Admit: 2019-07-25 | Discharge: 2019-07-26 | Disposition: A | Discharge status: Home / Self Care | Payer: No Typology Code available for payment source | Attending: Emergency Medicine | Admitting: Emergency Medicine

## 2019-07-25 DIAGNOSIS — S0003XA Contusion of scalp, initial encounter: Secondary | ICD-10-CM | POA: Diagnosis not present

## 2019-07-25 DIAGNOSIS — Y92410 Unspecified street and highway as the place of occurrence of the external cause: Secondary | ICD-10-CM | POA: Insufficient documentation

## 2019-07-25 DIAGNOSIS — T148XXA Other injury of unspecified body region, initial encounter: Secondary | ICD-10-CM | POA: Diagnosis not present

## 2019-07-25 DIAGNOSIS — Y999 Unspecified external cause status: Secondary | ICD-10-CM | POA: Diagnosis not present

## 2019-07-25 DIAGNOSIS — Z79899 Other long term (current) drug therapy: Secondary | ICD-10-CM | POA: Diagnosis not present

## 2019-07-25 DIAGNOSIS — S0990XA Unspecified injury of head, initial encounter: Secondary | ICD-10-CM | POA: Diagnosis present

## 2019-07-25 DIAGNOSIS — Y9389 Activity, other specified: Secondary | ICD-10-CM | POA: Diagnosis not present

## 2019-07-25 DIAGNOSIS — Z87891 Personal history of nicotine dependence: Secondary | ICD-10-CM | POA: Insufficient documentation

## 2019-07-25 LAB — PREGNANCY, URINE: Preg Test, Ur: NEGATIVE

## 2019-07-25 MED ORDER — KETOROLAC TROMETHAMINE 30 MG/ML IJ SOLN
15.0000 mg | Freq: Once | INTRAMUSCULAR | Status: AC
  Administered 2019-07-25: 23:00:00 15 mg via INTRAMUSCULAR
  Filled 2019-07-25: qty 1

## 2019-07-25 MED ORDER — KETOROLAC TROMETHAMINE 15 MG/ML IJ SOLN: 15.0000 mg | Freq: Once | INTRAMUSCULAR | Status: DC
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
## 2019-07-25 NOTE — ED Notes (Signed)
Pt ambulatory to and from bathroom with a steady gait.  
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
## 2019-07-26 MED ORDER — CYCLOBENZAPRINE HCL 10 MG PO TABS: 10.0000 mg | ORAL_TABLET | Freq: Two times a day (BID) | ORAL | 0 refills | Status: AC | PRN

## 2019-07-26 MED ORDER — IBUPROFEN 600 MG PO TABS: 600.0000 mg | ORAL_TABLET | Freq: Four times a day (QID) | ORAL | 0 refills | Status: AC | PRN

## 2019-07-26 NOTE — Discharge Instructions (Addendum)
Take medications as prescribed. Heat therapy for additional relief of soreness.   Return to the ED with any worsening symptoms or new concerns.

## 2021-10-04 LAB — TYPE AND SCREEN CARE EVERYWHERE
ABO/RH CARE EVERYWHERE: A POS
ANTIBODY SCREEN CARE EVERYWHERE: NEGATIVE

## 2021-10-04 LAB — HEPATITIS B SURFACE ANTIGEN CARE EVERYWHERE: HEPATITS B SURFACE ANTIGEN CARE EVERYWHERE: NONREACTIVE

## 2021-10-04 LAB — HIV ANTIGEN ANTIBODY CARE EVERYWHERE: HIV 1/2 PLUS O AG/AB SCREEN CARE EVERYWHERE: NONREACTIVE

## 2021-10-04 LAB — HEPATITIS C ANTIBODY CARE EVERYWHERE: HEPATITIS C ANTIBODY CARE EVERYWHERE: NONREACTIVE

## 2021-11-13 LAB — COVID-19 CARE EVERYWHERE: COVID-19 CARE EVERYWHERE: NEGATIVE TEXT

## 2021-11-13 LAB — TYPE AND SCREEN CARE EVERYWHERE
ABO/RH CARE EVERYWHERE: A POS
ANTIBODY SCREEN CARE EVERYWHERE: NEGATIVE

## 2021-11-13 LAB — CREATININE CARE EVERYWHERE
CREATININE CARE EVERYWHERE: 0.72 mg/dL (ref 0.5–1.1)
ESTIMATED GFR CARE EVERYWHERE: >90 mL/min/{1.73_m2} (ref >59)

## 2021-11-14 LAB — ABO/RH NEWBORN CARE EVERYWHERE

## 2022-06-17 NOTE — L&D Delivery Note (Signed)
OB/GYN DELIVERY NOTE    Date Time: 12/10/22 6:17 PM  Patient Name: Karina Mccullough, Karina Mccullough  Procedure: Normal Spontaneous Vaginal Delivery (NSVD)  Attending/Supervising MD: Sarita Bottom, MD  Delivering Provider: Sarita Bottom   Anesthesia: Local for Repair  Labor Course:  [name] is a 37 y.o. [phone] F @ [redacted]w[redacted]d (Estimated Date of Delivery: 12/08/22) admitted at: 12/10/2022  4:29 PM with [redacted] weeks gestation of pregnancy.  ROM: 12/10/2022  5:51 PM   Amniotic Fluid: Artificial , Clear   Complete Dilation:      Delivery: 12/10/2022  5:58 PM  (Second Stage:   hr   min)  Placenta: 12/10/2022  6:00 PM  (Third Stage:   hr   min)    New Intrapartum Conditions:   Intrapartum/Postpartum Conditions: None         Delivery Summary:   A viable female  infant was delivered in the vertex  presentation, direct occiput anterior position. No nuchal cord present. The anterior and posterior shoulders delivered without difficulty. The remainder of the delivery proceeded atraumatically. The infant was suctioned with the bulb suction before being placed atop the mother to be cared for by the attendant nurse.    After an intentional 60 second delay, the cord was doubly clamped and cut. Cord blood was then obtained due to maternal O Pos blood type.  Delivery of the placenta was then completed, first with fundal massage to encourage release, followed by suprapubic pressure and gentle traction on the umbilical cord, and it was noted to readily separate from the uterus. On inspection, the placenta appeared complete and intactwith a marginal cord with 3 vessels . Placenta removal was spontaneous  and it was discarded.     The perineum, periurethral areas and cervix were then assessed. Approx 2cc of 2% lidocaine injected into tissue.  A 1st degree laceration of the perineum was hemostatic and required no repair. The uterine fundus was massaged and noted to be firm following the delivery with minimal vaginal bleeding.    Estimated Blood  Loss: 150 mL    Baby STATs:   Delivery Time: 5:58 PM   Sex: female   Weight: 8 lb 0.8 oz (3650 g)   APGARs: : 8  : 9       Additional Notes:   Sponge, needle and instrument counts correct.    Sarita Bottom, MD

## 2022-06-22 ENCOUNTER — Emergency Department
Admission: EM | Admit: 2022-06-22 | Discharge: 2022-06-22 | Disposition: A | Discharge status: Home / Self Care | Payer: No Typology Code available for payment source | Attending: Emergency Medicine | Admitting: Emergency Medicine

## 2022-06-22 ENCOUNTER — Other Ambulatory Visit: Payer: Self-pay

## 2022-06-22 DIAGNOSIS — B349 Viral infection, unspecified: Secondary | ICD-10-CM

## 2022-06-22 DIAGNOSIS — J029 Acute pharyngitis, unspecified: Secondary | ICD-10-CM

## 2022-06-22 DIAGNOSIS — R058 Other specified cough: Secondary | ICD-10-CM | POA: Diagnosis present

## 2022-06-22 DIAGNOSIS — U071 COVID-19: Secondary | ICD-10-CM | POA: Diagnosis not present

## 2022-06-22 LAB — RESPIRATORY PANEL BASIC INPAT
INFLUENZA A: NEGATIVE
INFLUENZA B: NEGATIVE
RESPIRATORY SYNCYTIAL VIRUS: NEGATIVE
SARS-COV-2: POSITIVE

## 2022-06-22 LAB — RAPID STREP (POINT OF CARE): STREP SCREEN: NEGATIVE

## 2022-06-22 MED ORDER — IBUPROFEN 600 MG PO TABS
600.00 mg | ORAL_TABLET | Freq: Four times a day (QID) | ORAL | 0 refills | Status: AC | PRN
Start: 2022-06-22 — End: 2022-06-29

## 2022-06-22 MED ORDER — IBUPROFEN 400 MG PO TABS
800.00 mg | ORAL_TABLET | Freq: Once | ORAL | Status: AC
Start: 2022-06-22 — End: 2022-06-22
  Administered 2022-06-22: 800 mg via ORAL

## 2022-06-22 MED ORDER — ACETAMINOPHEN 325 MG PO TABS
650.00 mg | ORAL_TABLET | Freq: Four times a day (QID) | ORAL | 0 refills | Status: AC | PRN
Start: 2022-06-22 — End: 2022-06-29

## 2022-06-22 NOTE — ED Provider Notes (Signed)
The patient was seen primarily by me. ED nursing record was reviewed. Prior records as available electronically through the Epic record were reviewed.  Care language: Data Unavailable; Interpreter used.       I have reviewed prior nursing notes, prior records and outside records for this patient.       Triage Documentation       Samuel Jester, RN 06/22/2022 01:13                 37 yo F presents to ED via [facility] from seeking Covid test for viral symptoms x1 day. + sick contacts. +dry cough. Pt well appearing with even, unlabored respirations. No acute distress             HPI:    Louan Base is a 37 year old female with PMH of  has no past medical history on file. presenting with a chief complaint of congestion, sore throat, for 2 days.  Patient's child is also ill.  No fevers.  No other complaints.  No shortness of breath or chest pain.        ROS: Pertinent positives were reviewed as per the HPI above.   All other pertinent systems were reviewed and are negative.      Past Medical History/Problem list:  No past medical history on file.  There is no problem list on file for this patient.        Past Surgical History: No past surgical history on file.      Medications:  Current Facility-Administered Medications   Medication    ibuprofen (ADVIL) tablet 800 mg     Current Outpatient Medications   Medication Sig    ibuprofen (ADVIL) 600 MG tablet Take 1 tablet by mouth every 6 (six) hours as needed for Pain  for up to 7 days    acetaminophen (TYLENOL) 325 MG tablet Take 2 tablets by mouth every 6 (six) hours as needed for Pain  for up to 7 days         Social History: Social History    Tobacco Use      Smoking status: Not on file      Smokeless tobacco: Not on file    Alcohol use: Not on file        Allergies:  Review of Patient's Allergies indicates:  No Known Allergies      Physical Exam:  ED Triage Vitals   ED Triage Vitals Brief Group      Temp       Pulse       Resp       BP       SpO2       Pain Score         GENERAL: No acute distress.   SKIN:  Warm & Dry, no rash.  HEAD: Normocephalic, atraumatic.   NECK:  supple, normal ROM   LUNGS:  Clear to auscultation bilaterally. No wheezes, rales, rhonchi.   CARDIOVASCULAR:  RRR.  WWP throughout  ABDOMEN:  Soft, non-tender.  No guarding or rebound tenderness.   MUSCULOSKELETAL:  No obvious deformities.    NEUROLOGIC: Alert and oriented.  Cranial Nerves grossly intact. Moves all extremities well.  PSYCHIATRIC:  Appropriate for age, time of day, and situation.        ED Course and Medical Decision-making:    Quanasia Defino is a  37 year old female with PMH of  has no past medical history on file.  presenting with sore throat, congestion.  Likely viral syndrome given others in the home are ill.  Patient is extremely well-appearing.  Plan for a history of ibuprofen and Tylenol, return precautions.    Social determinants of health affected this patient's illness in care as they experience one or more of the following:  Lack of housing  Lack of access to preventative health  Uninsured  Poor health literacy    .        ED COURSE:         Medications Given:  Medications   ibuprofen (ADVIL) tablet 800 mg (has no administration in time range)       Labs:  Labs Reviewed   RAPID STREP (POINT OF CARE) - Normal   RESPIRATORY PANEL BASIC INPAT   THROAT CULTURE BETA STREP       Imaging:  No orders to display        Additional verbal discharge instructions were provided including patients diagnosis and follow up plan, as well as reasons to return to the Emergency Department which were discussed in detail.  Patient is agreeable with this management.         Condition: Improved and Stable    Disposition: Discharge    Diagnosis/Diagnoses:  Viral syndrome  Sore throat      This note was created with voice recognition software in the emergency department in real time.  Please excuse any errors which have not been edited out.

## 2022-06-22 NOTE — ED Triage Note (Signed)
37 yo F presents to ED via [facility] from seeking Covid test for viral symptoms x1 day. + sick contacts. +dry cough. Pt well appearing with even, unlabored respirations. No acute distress

## 2022-06-22 NOTE — Narrator Note (Signed)
Patient Disposition  Patient education for diagnosis, medications, activity, diet and follow-up.  Patient left ED 1:38 AM.  Patient rep received written instructions.    Interpreter to provide instructions: Yes; Interpreter ID: [id number]    Patient belongings with patient: YES    Have all existing LDAs been addressed? N/A    Have all IV infusions been stopped? N/A    Destination: Discharged to home. Swabbed with resp panel and strep culture-- sent to the lab. Medicated per Mar. Provided with discharge paperwork and instructions.  Acknowledges full understanding of discharge teaching provided. Cab called for pt and her son; mother has car seat at bedside for child. To remain in ED until cab arrives

## 2022-06-23 LAB — THROAT CULTURE BETA STREP

## 2022-10-10 ENCOUNTER — Encounter (INDEPENDENT_AMBULATORY_CARE_PROVIDER_SITE_OTHER): Payer: Self-pay | Admitting: Obstetrics and Gynecology

## 2022-10-21 ENCOUNTER — Ambulatory Visit (INDEPENDENT_AMBULATORY_CARE_PROVIDER_SITE_OTHER): Payer: No Typology Code available for payment source | Admitting: Family Nurse Practitioner

## 2022-10-21 ENCOUNTER — Encounter (INDEPENDENT_AMBULATORY_CARE_PROVIDER_SITE_OTHER): Payer: Self-pay | Admitting: Family Nurse Practitioner

## 2022-10-21 VITALS — BP 83/49 | HR 74 | Temp 97.6°F | Wt 157.8 lb

## 2022-10-21 DIAGNOSIS — Z3493 Encounter for supervision of normal pregnancy, unspecified, third trimester: Secondary | ICD-10-CM

## 2022-10-21 DIAGNOSIS — Z23 Encounter for immunization: Secondary | ICD-10-CM

## 2022-10-21 LAB — CBC AND DIFFERENTIAL
Absolute NRBC: 0 10*3/uL (ref 0.00–0.00)
Basophils Absolute Automated: 0.05 10*3/uL (ref 0.00–0.08)
Basophils Automated: 0.4 %
Eosinophils Absolute Automated: 0.38 10*3/uL (ref 0.00–0.44)
Eosinophils Automated: 3.2 %
Hematocrit: 31.8 % — ABNORMAL LOW (ref 34.7–43.7)
Hgb: 10 g/dL — ABNORMAL LOW (ref 11.4–14.8)
Immature Granulocytes Absolute: 0.05 10*3/uL (ref 0.00–0.07)
Immature Granulocytes: 0.4 %
Instrument Absolute Neutrophil Count: 8.54 10*3/uL — ABNORMAL HIGH (ref 1.10–6.33)
Lymphocytes Absolute Automated: 2.5 10*3/uL (ref 0.42–3.22)
Lymphocytes Automated: 20.9 %
MCH: 25.8 pg (ref 25.1–33.5)
MCHC: 31.4 g/dL — ABNORMAL LOW (ref 31.5–35.8)
MCV: 82.2 fL (ref 78.0–96.0)
MPV: 11.2 fL (ref 8.9–12.5)
Monocytes Absolute Automated: 0.45 10*3/uL (ref 0.21–0.85)
Monocytes: 3.8 %
Neutrophils Absolute: 8.54 10*3/uL — ABNORMAL HIGH (ref 1.10–6.33)
Neutrophils: 71.3 %
Nucleated RBC: 0 /100 WBC (ref 0.0–0.0)
Platelets: 311 10*3/uL (ref 142–346)
RBC: 3.87 10*6/uL — ABNORMAL LOW (ref 3.90–5.10)
RDW: 15 % (ref 11–15)
WBC: 11.97 10*3/uL — ABNORMAL HIGH (ref 3.10–9.50)

## 2022-10-21 LAB — THYROID STIMULATING HORMONE (TSH) WITH REFLEX TO FREE T4: TSH, Abn Reflex to Free T4, Serum: 1.41 u[IU]/mL (ref 0.35–4.94)

## 2022-10-21 LAB — VARICELLA ZOSTER VIRUS (VZV) ANTIBODY, IGG: Varicella, IgG: 1639 Index

## 2022-10-21 LAB — GLUCOSE CHALLENGE: Glucose Challenge: 130 mg/dL

## 2022-10-21 LAB — RUBELLA ANTIBODY, IGG: Rubella AB, IgG: 4.31 Index

## 2022-10-21 LAB — HEPATITIS C ANTIBODY, TOTAL: Hepatitis C, AB: NONREACTIVE

## 2022-10-21 LAB — SYPHILIS SCREEN, IGG AND IGM: Syphilis Screen IgG and IgM: NONREACTIVE

## 2022-10-21 LAB — HEPATITIS B (HBV) SURFACE ANTIGEN WITH REFLEX TO CONFIRMATION: Hepatitis B Surface Antigen: NONREACTIVE

## 2022-10-21 LAB — HIV-1/2, ANTIGEN AND ANTIBODY WITH REFLEX TO CONFIRMATION: HIV Ag/Ab, 4th Generation: NONREACTIVE

## 2022-10-21 NOTE — Progress Notes (Signed)
Initial Prenatal Visit: [facility]    37 y.o. [phone] at by LMP, EDD 12/10/2022. Pt feeling well today, no major complaints.  Transfer of care from [place]. Unsure about prenatal records and has not had formal US. Husband translating for patient, denies Nurse, learning disability. Denies vb, lof, ctx. Endorses good fetal movement.     Reports previously all NSVD, no complications. 2 boys and 1 daughter. Desires to know sex of baby.     Reports blood pressure normally low. Denies dizziness, chest pain, heart palpitations, or SOB this visit.     Pregnancy Complications  AMA  Transfer of Care from [place]    Genetic screening: denies  Carrier screening: denies  NT: missed window  AFP: missed window  RH + per records      ROS  Constitutional: Negative for fever.   HENT: Negative for hearing loss.    Eyes: Negative for blurred vision.   Respiratory: Negative for cough.    Cardiovascular: Negative for chest pain.   Gastrointestinal: Negative for heartburn.   Genitourinary: Negative for dysuria.   Musculoskeletal: Negative for myalgias.   Skin: Negative for rash.   Neurological: Negative for dizziness.   Endo/Heme/Allergies: Does not bruise/bleed easily.   Psychiatric/Behavioral: Negative for depression.     OB History   Gravida Para Term Preterm AB Living   4 3 3  0 0 3   SAB IAB Ectopic Multiple Live Births   0 0 0 0 0      # Outcome Date GA Lbr Len/2nd Weight Sex Delivery Anes PTL Lv   4 Current            3 Term            2 Term            1 Term              GYN: Patient's last menstrual period was 03/03/2022 (approximate).     History reviewed. No pertinent past medical history.    History reviewed. No pertinent surgical history.    No current outpatient medications on file prior to visit.     No current facility-administered medications on file prior to visit.        Not on File    Social History     Socioeconomic History    Marital status: Married   Tobacco Use    Smoking status: Never    Smokeless tobacco: Never   Vaping Use     Vaping status: Never Used       History reviewed. No pertinent family history.    PHYSICAL EXAM  BP (!) 83/49   Pulse 74   Temp 97.6 F (36.4 C)   Wt 157 lb 12.8 oz (71.6 kg)   LMP 03/03/2022 (Approximate)   Gen alert oriented   Cv RRR  Lungs ctab  Abd soft nt  Ext no calf pain, no edema  AV:[id number]    BSUS:  FHT within normal range  MVP subjectively within normal range  Vertex    A/P:     37 y.o. [id number] at Unknown by LMP c/w [place]    1) initial ob  - Prenatal labs drawn today.   - UCx ordered today  - 1hr gtt drawn today  - TDAP this visit  - Carrier screening options reviewed including cystic fibrosis carrier screening:   - Aneuploidy screening options reviewed and patient desires  - Level II [place] ordered  - Reviewed practice setup, IFH.  -  RTO in 4 weeks      2) Pregnancy complications  - AMA  - Transfer of Care    Roxine Caddy, FNP-C  [facility]

## 2022-10-21 NOTE — Progress Notes (Signed)
After verifying allergies and obtaining consent, per orders of NP Bezold, injection of TDAP given in the left deltoid by Corissa Oguinn, LPN. Patient instructed to remain in clinic for 15 minutes afterwards, and to report any adverse reaction to me immediately.    Verified by Natalie Jaldin, MA

## 2022-10-22 ENCOUNTER — Telehealth (INDEPENDENT_AMBULATORY_CARE_PROVIDER_SITE_OTHER): Payer: Self-pay

## 2022-10-22 LAB — PRENATAL  WORKUP
AB Screen Gel: NEGATIVE
ABO Rh: O POS

## 2022-10-22 NOTE — Telephone Encounter (Signed)
Called patient husband to discuss lab results and need for iron supplements. Discussed red blood cell count is low and iron is needed to bring it up. Vitron C is the recommended brand. Patient husband requested written information, sent my chart activation code to email. Advised to call back with further questions.

## 2022-10-26 LAB — LAB USE ONLY - URINE CHLAMYDIA TRACHOMATIS, NEISSERIA GONORRHEA AND TRICHOMONAS VAGINALIS, PCR
Chlamydia DNA by PCR: NEGATIVE
Neisseria gonorrhoeae by PCR: NEGATIVE
Trichomonas vaginalis, DNA: NEGATIVE

## 2022-10-28 ENCOUNTER — Other Ambulatory Visit (INDEPENDENT_AMBULATORY_CARE_PROVIDER_SITE_OTHER): Payer: Self-pay | Admitting: Family Nurse Practitioner

## 2022-10-28 DIAGNOSIS — Z3493 Encounter for supervision of normal pregnancy, unspecified, third trimester: Secondary | ICD-10-CM

## 2022-11-04 ENCOUNTER — Encounter (INDEPENDENT_AMBULATORY_CARE_PROVIDER_SITE_OTHER): Payer: Self-pay | Admitting: Family Nurse Practitioner

## 2022-11-04 ENCOUNTER — Ambulatory Visit (INDEPENDENT_AMBULATORY_CARE_PROVIDER_SITE_OTHER): Payer: No Typology Code available for payment source | Admitting: Family Nurse Practitioner

## 2022-11-04 ENCOUNTER — Ambulatory Visit: Payer: No Typology Code available for payment source

## 2022-11-04 VITALS — BP 112/71 | HR 84 | Temp 97.1°F | Wt 163.0 lb

## 2022-11-04 DIAGNOSIS — Z3493 Encounter for supervision of normal pregnancy, unspecified, third trimester: Secondary | ICD-10-CM

## 2022-11-04 NOTE — Progress Notes (Signed)
Routine Prenatal Visit    Karina Mccullough is a 37 y.o. female [redacted]w[redacted]d. Doing well, no complaints. Reports continuing to take Vitron C for anemia. Denies vb, lof, ctx. + FM.     Husband translating for patient. Refusing translator.     Reports not able to schedule OB US. [place] full until late June and [place] testing was cancelled on them. Husband unsure why there was a cancellation. Has tried to call back to get an appointment without response.    Pregnancy Complications:   AMA  Transfer of Care from [place]- late prenatal care  Anemic    Genetic screening: declines  Carrier screening: declines  NT: missed window  AFP: missed window  Immunizations: Tdap [x]     O: BP 112/71   Pulse 84   Temp 97.1 F (36.2 C)   Wt 163 lb (73.9 kg)   LMP 03/03/2022 (Approximate)     Vertex  FHT within normal limits: 142  MVP subjectively within normal limits  Movement present    A/P: IUP @ [redacted]w[redacted]d  - Reviewed prenatal labs  - Continue routine prenatal care  - Questions answered and anticipatory guidance given   -Discussed need for OB 14wk + [place] in great detail. STAT US order placed. Patient and spouse aware to call again and to call out office if unable to schedule. Reviewed this extensively on importance of [place] for baby and mother. ATC number provided.  - GBS next visit  - Reviewed preterm labor precautions and kick counts  - Patient has pediatrician and breast pump  - Return to clinic 1 week    Roxine Caddy, FNP-C  [facility]

## 2022-11-05 ENCOUNTER — Inpatient Hospital Stay
Admission: RE | Admit: 2022-11-05 | Discharge: 2022-11-05 | Disposition: A | Discharge status: Home / Self Care | Payer: No Typology Code available for payment source | Source: Ambulatory Visit | Attending: Family Nurse Practitioner | Admitting: Family Nurse Practitioner

## 2022-11-05 DIAGNOSIS — O09523 Supervision of elderly multigravida, third trimester: Secondary | ICD-10-CM | POA: Insufficient documentation

## 2022-11-05 DIAGNOSIS — Z3493 Encounter for supervision of normal pregnancy, unspecified, third trimester: Secondary | ICD-10-CM

## 2022-11-05 DIAGNOSIS — Z3A35 35 weeks gestation of pregnancy: Secondary | ICD-10-CM | POA: Insufficient documentation

## 2022-11-12 ENCOUNTER — Ambulatory Visit (INDEPENDENT_AMBULATORY_CARE_PROVIDER_SITE_OTHER): Payer: No Typology Code available for payment source | Admitting: Obstetrics & Gynecology

## 2022-11-12 ENCOUNTER — Encounter (INDEPENDENT_AMBULATORY_CARE_PROVIDER_SITE_OTHER): Payer: Self-pay | Admitting: Obstetrics & Gynecology

## 2022-11-12 VITALS — BP 113/72 | HR 85 | Temp 97.6°F | Wt 165.4 lb

## 2022-11-12 DIAGNOSIS — Z3493 Encounter for supervision of normal pregnancy, unspecified, third trimester: Secondary | ICD-10-CM

## 2022-11-12 NOTE — Progress Notes (Signed)
Routine Prenatal Visit    Karina Mccullough is a 37 y.o. female [redacted]w[redacted]d. Patient reports she is doing well and denies any acute concerns. She denies VB, LOF or contractions. Reports good FM. Patient is accompanied by her husband and two of her children. She declines an interpreter and wishes for her husband to interpret.    Patient has four children age 68,5, and 7. All deliveries were term NSVD's.    Pregnancy Complications:   AMA  Transfer of Care from [place]- late prenatal care  Anemic    Genetic screening: declines  Carrier screening: declines  NT: missed window  AFP: missed window  Immunizations: Tdap [x]   Late MFM Level II sono was WNL at 35 weeks.     O: BP 113/72   Pulse 85   Temp 97.6 F (36.4 C)   Wt 165 lb 6.4 oz (75 kg)   LMP 03/03/2022 (Approximate)     Vertex  FHT within normal limits: 158  MVP 5cm    A/P: IUP @ [redacted]w[redacted]d  - Continue routine prenatal care  - Questions answered and anticipatory guidance given   - GBS collected - please re-collect next visit (incorrect swab)  - Reviewed labor precautions and kick counts  - Patient has pediatrician and breast pump  - Return to clinic 1 week    Discussed with Dr. Vance Peper, MD PGY-2    I have personally seen and examined the patient and agree with the above note.    Need to repeat GBS next visit.     Bruce Donath, MD  [facility]

## 2022-11-18 ENCOUNTER — Ambulatory Visit (INDEPENDENT_AMBULATORY_CARE_PROVIDER_SITE_OTHER): Payer: No Typology Code available for payment source | Admitting: Family Nurse Practitioner

## 2022-11-18 ENCOUNTER — Encounter (INDEPENDENT_AMBULATORY_CARE_PROVIDER_SITE_OTHER): Payer: Self-pay | Admitting: Family Nurse Practitioner

## 2022-11-18 VITALS — BP 96/59 | HR 88 | Temp 97.2°F | Wt 165.8 lb

## 2022-11-18 DIAGNOSIS — Z3493 Encounter for supervision of normal pregnancy, unspecified, third trimester: Secondary | ICD-10-CM

## 2022-11-18 NOTE — Progress Notes (Signed)
Routine Prenatal Visit    Kensy Susette Demedeiros is a 37 y.o. female [redacted]w[redacted]d.     Patient reports she is doing well and denies any acute concerns. She denies VB, LOF or contractions. Reports good FM. Patient is accompanied by her husband and two of her children. She declines an interpreter and wishes for her husband to interpret.    Patient has four children age 6,5, and 7. All deliveries were term NSVD's.    Pregnancy Complications:   AMA  Transfer of Care from [place]- late prenatal care  Anemic    Genetic screening: declines  Carrier screening: declines  NT: missed window  AFP: missed window  Immunizations: Tdap [x]   Late MFM Level II sono was WNL at 35 weeks.     O: BP 96/59   Pulse 88   Temp 97.2 F (36.2 C)   Wt 165 lb 12.8 oz (75.2 kg)   LMP 03/03/2022 (Approximate)     Vertex  FHT within normal limits:   MVP within subjectively normal limits    A/P: IUP @ [redacted]w[redacted]d  - Continue routine prenatal care  - Questions answered and anticipatory guidance given   - GBS collected  - Reviewed labor precautions and kick counts  - Patient has pediatrician and breast pump  - Return to clinic 1 week    Roxine Caddy, FNP-C  [facility]

## 2022-11-25 ENCOUNTER — Encounter (INDEPENDENT_AMBULATORY_CARE_PROVIDER_SITE_OTHER): Payer: Self-pay | Admitting: Family Nurse Practitioner

## 2022-11-25 ENCOUNTER — Ambulatory Visit (INDEPENDENT_AMBULATORY_CARE_PROVIDER_SITE_OTHER): Payer: No Typology Code available for payment source | Admitting: Family Nurse Practitioner

## 2022-11-25 VITALS — BP 97/60 | HR 77 | Temp 97.5°F | Wt 168.0 lb

## 2022-11-25 DIAGNOSIS — Z3493 Encounter for supervision of normal pregnancy, unspecified, third trimester: Secondary | ICD-10-CM

## 2022-11-25 NOTE — Progress Notes (Signed)
Routine Prenatal Visit    Karina Mccullough is a 37 y.o. female [redacted]w[redacted]d. No complaints.     Patient reports she is doing well and denies any acute concerns. She denies VB, LOF or contractions. Reports good FM. Patient is accompanied by her husband and two of her children. She declines an interpreter and wishes for her husband to interpret.    Patient has four children age 71,5, and 7. All deliveries were term NSVD's.    Pregnancy Complications:   AMA  Transfer of Care from [place]- late prenatal care  Anemic    Genetic screening: declines  Carrier screening: declines  NT: missed window  AFP: missed window  Immunizations: Tdap [x]   O positive  GBS positive  Late MFM Level II sono was WNL at 35 weeks.     O: BP 97/60   Pulse 77   Temp 97.5 F (36.4 C)   Wt 168 lb (76.2 kg)   LMP 03/03/2022 (Approximate)     Vertex  FHT within normal limits  MVP within subjectively normal limits    Declines SVE    A/P: IUP @ [redacted]w[redacted]d  - Continue routine prenatal care  - Questions answered and anticipatory guidance given   - GBS positive  - Reviewed labor precautions and kick counts  - Return to clinic 1 week    Roxine Caddy, FNP-C  [facility]

## 2022-11-26 LAB — GROUP B STREP TRANSCRIBED: GBS Transcribed: POSITIVE

## 2022-12-03 ENCOUNTER — Ambulatory Visit (INDEPENDENT_AMBULATORY_CARE_PROVIDER_SITE_OTHER): Payer: No Typology Code available for payment source | Admitting: Registered Nurse

## 2022-12-03 ENCOUNTER — Encounter (INDEPENDENT_AMBULATORY_CARE_PROVIDER_SITE_OTHER): Payer: Self-pay | Admitting: Registered Nurse

## 2022-12-03 VITALS — BP 105/69 | HR 96 | Temp 97.0°F | Wt 167.0 lb

## 2022-12-03 DIAGNOSIS — Z3493 Encounter for supervision of normal pregnancy, unspecified, third trimester: Secondary | ICD-10-CM

## 2022-12-03 NOTE — Progress Notes (Signed)
Routine Prenatal Visit    Karina Mccullough is a 37 y.o. female at [redacted]w[redacted]d. No complaints.     Patient reports she is doing well and denies any acute concerns. She denies VB, LOF or contractions. Reports good FM. Patient is accompanied by her husband and her youngest child. She declines an interpreter and wishes for her husband to interpret.    Pregnancy Complications:   AMA  Transfer of Care from [place]- late prenatal care  Anemic  Prior NSVD x 3    Genetic screening: declines  Carrier screening: declines  NT: missed window  AFP: missed window  Immunizations: Tdap [x]   O positive  GBS positive  Late MFM Level II sono was WNL at 35 weeks.     O: BP 105/69   Pulse 96   Temp 97 F (36.1 C)   Wt 167 lb (75.8 kg)   LMP 03/03/2022 (Approximate)     Vertex  FHT within normal limits  MVP within subjectively normal limits    Declines SVE    A/P: IUP @ [redacted]w[redacted]d  - Continue routine prenatal care  - Questions answered and anticipatory guidance given   - Discussed induction if needed, patient prefers to wait. Will schedule at next visit if still not delivered  - Reviewed labor precautions and kick counts  - Return to clinic 1 week    Lucretia Field, DNP, FNP-BC  [facility]

## 2022-12-09 ENCOUNTER — Ambulatory Visit (INDEPENDENT_AMBULATORY_CARE_PROVIDER_SITE_OTHER): Payer: No Typology Code available for payment source | Admitting: Registered Nurse

## 2022-12-09 VITALS — BP 98/58 | HR 83 | Temp 97.0°F | Wt 167.4 lb

## 2022-12-09 DIAGNOSIS — O99013 Anemia complicating pregnancy, third trimester: Secondary | ICD-10-CM

## 2022-12-09 DIAGNOSIS — Z3493 Encounter for supervision of normal pregnancy, unspecified, third trimester: Secondary | ICD-10-CM

## 2022-12-09 DIAGNOSIS — O09523 Supervision of elderly multigravida, third trimester: Secondary | ICD-10-CM

## 2022-12-09 DIAGNOSIS — O99820 Streptococcus B carrier state complicating pregnancy: Secondary | ICD-10-CM

## 2022-12-09 DIAGNOSIS — Z3A40 40 weeks gestation of pregnancy: Secondary | ICD-10-CM

## 2022-12-09 NOTE — Progress Notes (Signed)
Routine Prenatal Visit    Karina Mccullough is a 37 y.o. female at [redacted]w[redacted]d. No complaints.     Patient reports she is doing well and denies any acute concerns. She denies VB, LOF or contractions. Reports good FM. Patient is accompanied by her husband and her youngest child. She declines an interpreter and wishes for her husband to interpret.    Pregnancy Complications:   AMA  Transfer of Care from [place]- late prenatal care  Anemic  Prior NSVD x 3    Genetic screening: declines  Carrier screening: declines  NT: missed window  AFP: missed window  Immunizations: Tdap [x]   O positive  GBS positive  Late MFM Level II sono was WNL at 35 weeks.     O: BP 98/58   Pulse 83   Temp 97 F (36.1 C)   Wt 167 lb 6.4 oz (75.9 kg)   LMP 03/03/2022 (Approximate)     Vertex  FHT within normal limits  MVP within subjectively normal limits    SVE: 1/50/-3, soft, posterior    A/P: IUP @ [redacted]w[redacted]d  - Continue routine prenatal care  - Questions answered and anticipatory guidance given   - Discussed induction process, patient is interested in IOL 7/1. Request sent to scheduler.  - Discussed labor precautions and kick counts  - RTO for 6 week PP check or sooner for acute issues    Lucretia Field, DNP, FNP-BC  [facility]

## 2022-12-10 ENCOUNTER — Inpatient Hospital Stay
Admission: EM | Admit: 2022-12-10 | Discharge: 2022-12-12 | DRG: 807 | Disposition: A | Discharge status: Home / Self Care | Payer: No Typology Code available for payment source | Attending: Obstetrics & Gynecology | Admitting: Obstetrics & Gynecology

## 2022-12-10 ENCOUNTER — Encounter (HOSPITAL_BASED_OUTPATIENT_CLINIC_OR_DEPARTMENT_OTHER): Payer: Self-pay | Admitting: Obstetrics & Gynecology

## 2022-12-10 ENCOUNTER — Encounter (INDEPENDENT_AMBULATORY_CARE_PROVIDER_SITE_OTHER): Payer: Self-pay

## 2022-12-10 DIAGNOSIS — O99824 Streptococcus B carrier state complicating childbirth: Secondary | ICD-10-CM | POA: Diagnosis present

## 2022-12-10 DIAGNOSIS — O9902 Anemia complicating childbirth: Secondary | ICD-10-CM | POA: Diagnosis present

## 2022-12-10 DIAGNOSIS — O480 Post-term pregnancy: Principal | ICD-10-CM | POA: Diagnosis present

## 2022-12-10 DIAGNOSIS — Z3A40 40 weeks gestation of pregnancy: Secondary | ICD-10-CM

## 2022-12-10 HISTORY — DX: Anemia, unspecified: D64.9

## 2022-12-10 LAB — CBC
Absolute nRBC: 0 10*3/uL (ref <=0.00)
Hematocrit: 36.4 % (ref 34.7–43.7)
Hemoglobin: 11.4 g/dL (ref 11.4–14.8)
MCH: 26.6 pg (ref 25.1–33.5)
MCHC: 31.3 g/dL — ABNORMAL LOW (ref 31.5–35.8)
MCV: 84.8 fL (ref 78.0–96.0)
MPV: 10.2 fL (ref 8.9–12.5)
Platelet Count: 360 10*3/uL — ABNORMAL HIGH (ref 142–346)
RBC: 4.29 10*6/uL (ref 3.90–5.10)
RDW: 21 % — ABNORMAL HIGH (ref 11–15)
WBC: 13.22 10*3/uL — ABNORMAL HIGH (ref 3.10–9.50)
nRBC %: 0 /100 WBC (ref <=0.0)

## 2022-12-10 LAB — TYPE AND SCREEN
ABO Rh: O POS
Antibody Screen: NEGATIVE

## 2022-12-10 MED ORDER — PENICILLIN 5 MU IN NORMAL SALINE 100 ML (CNR)
5.0000 10*6.[IU] | Freq: Once | Status: AC
Start: 2022-12-10 — End: 2022-12-10
  Administered 2022-12-10: 5 10*6.[IU] via INTRAVENOUS
  Filled 2022-12-10: qty 100

## 2022-12-10 MED ORDER — BENZOCAINE 20% +/- MENTHOL 0.5% EX AERO (WRAP)
1.0000 | INHALATION_SPRAY | CUTANEOUS | Status: DC | PRN
Start: 2022-12-10 — End: 2022-12-12
  Administered 2022-12-10: 1 via TOPICAL
  Filled 2022-12-10: qty 57

## 2022-12-10 MED ORDER — SOD CITRATE-CITRIC ACID 500-334 MG/5ML PO SOLN
30.0000 mL | Freq: Once | ORAL | Status: DC | PRN
Start: 2022-12-10 — End: 2022-12-10

## 2022-12-10 MED ORDER — LACTATED RINGERS IV SOLN
INTRAVENOUS | Status: DC
Start: 2022-12-10 — End: 2022-12-10

## 2022-12-10 MED ORDER — NIFEDIPINE 10 MG PO CAPS
10.0000 mg | ORAL_CAPSULE | Freq: Once | ORAL | Status: DC | PRN
Start: 2022-12-10 — End: 2022-12-10

## 2022-12-10 MED ORDER — PROCHLORPERAZINE 25 MG RE SUPP
25.0000 mg | Freq: Two times a day (BID) | RECTAL | Status: DC | PRN
Start: 2022-12-10 — End: 2022-12-10

## 2022-12-10 MED ORDER — LIDOCAINE HCL 2 % IJ SOLN
5.0000 mL | Freq: Once | INTRAMUSCULAR | Status: AC
Start: 2022-12-10 — End: 2022-12-10
  Administered 2022-12-10: 5 mL

## 2022-12-10 MED ORDER — SIMETHICONE 80 MG PO CHEW
80.0000 mg | CHEWABLE_TABLET | Freq: Four times a day (QID) | ORAL | Status: DC | PRN
Start: 2022-12-10 — End: 2022-12-10

## 2022-12-10 MED ORDER — AZITHROMYCIN 500 MG IN 250 ML NS IVPB VIAL-MATE (CNR)
500.0000 mg | Freq: Once | INTRAVENOUS | Status: DC | PRN
Start: 2022-12-10 — End: 2022-12-10

## 2022-12-10 MED ORDER — METHYLERGONOVINE MALEATE 0.2 MG/ML IJ SOLN
200.0000 ug | INTRAMUSCULAR | Status: DC | PRN
Start: 2022-12-10 — End: 2022-12-10

## 2022-12-10 MED ORDER — OXYTOCIN 10 UNIT/ML IJ SOLN
10.0000 [IU] | Freq: Once | INTRAMUSCULAR | Status: DC | PRN
Start: 2022-12-10 — End: 2022-12-10

## 2022-12-10 MED ORDER — SOD CITRATE-CITRIC ACID 500-334 MG/5ML PO SOLN
30.0000 mL | Freq: Once | ORAL | Status: DC | PRN
Start: 2022-12-10 — End: 2022-12-12

## 2022-12-10 MED ORDER — NIFEDIPINE 10 MG PO CAPS
10.0000 mg | ORAL_CAPSULE | Freq: Once | ORAL | Status: DC | PRN
Start: 2022-12-10 — End: 2022-12-12

## 2022-12-10 MED ORDER — ACETAMINOPHEN 500 MG PO TABS
1000.0000 mg | ORAL_TABLET | Freq: Once | ORAL | Status: DC
Start: 2022-12-10 — End: 2022-12-10

## 2022-12-10 MED ORDER — PROCHLORPERAZINE EDISYLATE 10 MG/2ML IJ SOLN
10.0000 mg | Freq: Four times a day (QID) | INTRAMUSCULAR | Status: DC | PRN
Start: 2022-12-10 — End: 2022-12-10

## 2022-12-10 MED ORDER — MISOPROSTOL 200 MCG PO TABS
1000.0000 ug | ORAL_TABLET | Freq: Once | ORAL | Status: DC | PRN
Start: 2022-12-10 — End: 2022-12-12

## 2022-12-10 MED ORDER — NALOXONE HCL 0.4 MG/ML IJ SOLN (WRAP)
0.2000 mg | INTRAMUSCULAR | Status: DC | PRN
Start: 2022-12-10 — End: 2022-12-10

## 2022-12-10 MED ORDER — MISOPROSTOL 200 MCG PO TABS
1000.0000 ug | ORAL_TABLET | Freq: Once | ORAL | Status: DC | PRN
Start: 2022-12-10 — End: 2022-12-10

## 2022-12-10 MED ORDER — PENICILLIN 2.5 MU IN NORMAL SALINE 100 ML (CNR)
2.5000 10*6.[IU] | Status: DC
Start: 2022-12-10 — End: 2022-12-10

## 2022-12-10 MED ORDER — CARBOPROST TROMETHAMINE 250 MCG/ML IM SOLN
250.0000 ug | Freq: Once | INTRAMUSCULAR | Status: DC | PRN
Start: 2022-12-10 — End: 2022-12-10

## 2022-12-10 MED ORDER — PROCHLORPERAZINE MALEATE 5 MG PO TABS
10.0000 mg | ORAL_TABLET | Freq: Four times a day (QID) | ORAL | Status: DC | PRN
Start: 2022-12-10 — End: 2022-12-10

## 2022-12-10 MED ORDER — OXYCODONE HCL 5 MG PO TABS
5.0000 mg | ORAL_TABLET | Freq: Once | ORAL | Status: DC | PRN
Start: 2022-12-10 — End: 2022-12-10

## 2022-12-10 MED ORDER — ELITE-OB 50-1.25 MG PO TABS
1.0000 | ORAL_TABLET | Freq: Every day | ORAL | Status: DC
Start: 2022-12-11 — End: 2022-12-12
  Administered 2022-12-11 – 2022-12-12 (×2): 1 via ORAL
  Filled 2022-12-10 (×2): qty 1

## 2022-12-10 MED ORDER — TERBUTALINE SULFATE 1 MG/ML IJ SOLN
0.2500 mg | Freq: Once | INTRAMUSCULAR | Status: DC | PRN
Start: 2022-12-10 — End: 2022-12-10

## 2022-12-10 MED ORDER — TRANEXAMIC ACID-NACL 1000-0.7 MG/100ML-% IV SOLN
1000.0000 mg | Freq: Once | INTRAVENOUS | Status: DC | PRN
Start: 2022-12-10 — End: 2022-12-10

## 2022-12-10 MED ORDER — LIDOCAINE HCL (PF) 1 % IJ SOLN
10.0000 mL | INTRAMUSCULAR | Status: DC
Start: 2022-12-10 — End: 2022-12-10

## 2022-12-10 MED ORDER — IBUPROFEN 600 MG PO TABS
600.0000 mg | ORAL_TABLET | Freq: Four times a day (QID) | ORAL | Status: DC
Start: 2022-12-11 — End: 2022-12-12
  Administered 2022-12-11 – 2022-12-12 (×7): 600 mg via ORAL
  Filled 2022-12-10 (×7): qty 1

## 2022-12-10 MED ORDER — WITCH HAZEL EX PADS (WRAP)
1.0000 | MEDICATED_PAD | CUTANEOUS | Status: DC | PRN
Start: 2022-12-10 — End: 2022-12-12
  Administered 2022-12-10 – 2022-12-12 (×2): 1 via TOPICAL
  Filled 2022-12-10 (×2): qty 40

## 2022-12-10 MED ORDER — MISOPROSTOL 200 MCG PO TABS
ORAL_TABLET | ORAL | Status: AC
Start: 2022-12-10 — End: 2022-12-11
  Filled 2022-12-10: qty 5

## 2022-12-10 MED ORDER — NIFEDIPINE 10 MG PO CAPS
20.0000 mg | ORAL_CAPSULE | Freq: Once | ORAL | Status: DC | PRN
Start: 2022-12-10 — End: 2022-12-12

## 2022-12-10 MED ORDER — LANOLIN EX OINT
TOPICAL_OINTMENT | CUTANEOUS | Status: DC | PRN
Start: 2022-12-10 — End: 2022-12-12

## 2022-12-10 MED ORDER — METHYLERGONOVINE MALEATE 0.2 MG/ML IJ SOLN
0.2000 mg | Freq: Four times a day (QID) | INTRAMUSCULAR | Status: DC | PRN
Start: 2022-12-10 — End: 2022-12-12

## 2022-12-10 MED ORDER — ACETAMINOPHEN 325 MG PO TABS
650.0000 mg | ORAL_TABLET | ORAL | Status: DC | PRN
Start: 2022-12-10 — End: 2022-12-10

## 2022-12-10 MED ORDER — NIFEDIPINE 10 MG PO CAPS
20.0000 mg | ORAL_CAPSULE | Freq: Once | ORAL | Status: DC | PRN
Start: 2022-12-10 — End: 2022-12-10

## 2022-12-10 MED ORDER — MAGNESIUM HYDROXIDE 400 MG/5ML PO SUSP
30.0000 mL | Freq: Four times a day (QID) | ORAL | Status: DC | PRN
Start: 2022-12-10 — End: 2022-12-12

## 2022-12-10 MED ORDER — OXYTOCIN-SODIUM CHLORIDE 30-0.9 UT/500ML-% IV SOLN
125.0000 m[IU]/min | INTRAVENOUS | Status: DC | PRN
Start: 2022-12-10 — End: 2022-12-10
  Administered 2022-12-10: 125 m[IU]/min via INTRAVENOUS
  Filled 2022-12-10: qty 500

## 2022-12-10 MED ORDER — FAMOTIDINE 20 MG PO TABS
20.0000 mg | ORAL_TABLET | Freq: Once | ORAL | Status: DC
Start: 2022-12-10 — End: 2022-12-10

## 2022-12-10 MED ORDER — ONDANSETRON HCL 4 MG/2ML IJ SOLN
4.0000 mg | Freq: Three times a day (TID) | INTRAMUSCULAR | Status: DC | PRN
Start: 2022-12-10 — End: 2022-12-10

## 2022-12-10 MED ORDER — ONDANSETRON 4 MG PO TBDP
4.0000 mg | ORAL_TABLET | Freq: Three times a day (TID) | ORAL | Status: DC | PRN
Start: 2022-12-10 — End: 2022-12-10

## 2022-12-10 MED ORDER — BISACODYL 10 MG RE SUPP
10.0000 mg | Freq: Every day | RECTAL | Status: DC | PRN
Start: 2022-12-10 — End: 2022-12-12

## 2022-12-10 MED ORDER — PROCHLORPERAZINE MALEATE 5 MG PO TABS
10.0000 mg | ORAL_TABLET | Freq: Four times a day (QID) | ORAL | Status: DC | PRN
Start: 2022-12-10 — End: 2022-12-12

## 2022-12-10 MED ORDER — CALCIUM CARBONATE ANTACID 500 MG PO CHEW
1000.0000 mg | CHEWABLE_TABLET | Freq: Three times a day (TID) | ORAL | Status: DC | PRN
Start: 2022-12-10 — End: 2022-12-12

## 2022-12-10 MED ORDER — OXYTOCIN-SODIUM CHLORIDE 30-0.9 UT/500ML-% IV SOLN
INTRAVENOUS | Status: AC
Start: 2022-12-10 — End: 2022-12-10
  Administered 2022-12-10: 999 m[IU]/min via INTRAVENOUS
  Filled 2022-12-10: qty 1000

## 2022-12-10 MED ORDER — ACETAMINOPHEN 500 MG PO TABS
1000.0000 mg | ORAL_TABLET | Freq: Four times a day (QID) | ORAL | Status: DC | PRN
Start: 2022-12-10 — End: 2022-12-12

## 2022-12-10 MED ORDER — MEASLES, MUMPS & RUBELLA VAC IJ SOLR
0.5000 mL | INTRAMUSCULAR | Status: DC | PRN
Start: 2022-12-10 — End: 2022-12-12

## 2022-12-10 MED ORDER — IBUPROFEN 600 MG PO TABS
600.0000 mg | ORAL_TABLET | Freq: Once | ORAL | Status: AC | PRN
Start: 2022-12-10 — End: 2022-12-10
  Administered 2022-12-10: 600 mg via ORAL
  Filled 2022-12-10: qty 1

## 2022-12-10 MED ORDER — TETANUS-DIPHTH-ACELL PERTUSSIS 5-2.5-18.5 LF-MCG/0.5 IM SUSP/SUSY (WR)
0.5000 mL | INTRAMUSCULAR | Status: DC | PRN
Start: 2022-12-10 — End: 2022-12-12

## 2022-12-10 MED ORDER — PROCHLORPERAZINE 25 MG RE SUPP
25.0000 mg | Freq: Two times a day (BID) | RECTAL | Status: DC | PRN
Start: 2022-12-10 — End: 2022-12-12

## 2022-12-10 MED ORDER — ONDANSETRON 4 MG PO TBDP
4.0000 mg | ORAL_TABLET | Freq: Three times a day (TID) | ORAL | Status: DC | PRN
Start: 2022-12-10 — End: 2022-12-12

## 2022-12-10 MED ORDER — ACETAMINOPHEN 650 MG RE SUPP
975.0000 mg | Freq: Four times a day (QID) | RECTAL | Status: DC | PRN
Start: 2022-12-10 — End: 2022-12-12

## 2022-12-10 MED ORDER — HYDROCORTISONE 1 % EX OINT
TOPICAL_OINTMENT | Freq: Three times a day (TID) | CUTANEOUS | Status: DC | PRN
Start: 2022-12-10 — End: 2022-12-12

## 2022-12-10 MED ORDER — NALOXONE HCL 0.4 MG/ML IJ SOLN (WRAP)
0.2000 mg | INTRAMUSCULAR | Status: DC | PRN
Start: 2022-12-10 — End: 2022-12-12

## 2022-12-10 MED ORDER — SENNOSIDES-DOCUSATE SODIUM 8.6-50 MG PO TABS
1.0000 | ORAL_TABLET | Freq: Every evening | ORAL | Status: DC | PRN
Start: 2022-12-10 — End: 2022-12-12
  Administered 2022-12-10 – 2022-12-12 (×2): 1 via ORAL
  Filled 2022-12-10 (×2): qty 1

## 2022-12-10 MED ORDER — ACETAMINOPHEN 650 MG RE SUPP
650.0000 mg | RECTAL | Status: DC | PRN
Start: 2022-12-10 — End: 2022-12-10

## 2022-12-10 MED ORDER — SIMETHICONE 80 MG PO CHEW
80.0000 mg | CHEWABLE_TABLET | Freq: Four times a day (QID) | ORAL | Status: DC | PRN
Start: 2022-12-10 — End: 2022-12-12

## 2022-12-10 MED ORDER — TRANEXAMIC ACID-NACL 1000-0.7 MG/100ML-% IV SOLN
INTRAVENOUS | Status: AC
Start: 2022-12-10 — End: 2022-12-11
  Filled 2022-12-10: qty 100

## 2022-12-10 MED ORDER — CALCIUM CARBONATE ANTACID 500 MG PO CHEW
1000.0000 mg | CHEWABLE_TABLET | Freq: Three times a day (TID) | ORAL | Status: DC | PRN
Start: 2022-12-10 — End: 2022-12-10

## 2022-12-10 MED ORDER — METHYLERGONOVINE MALEATE 0.2 MG PO TABS
0.2000 mg | ORAL_TABLET | Freq: Four times a day (QID) | ORAL | Status: DC | PRN
Start: 2022-12-10 — End: 2022-12-12
  Filled 2022-12-10: qty 1

## 2022-12-10 MED ORDER — OXYCODONE HCL 5 MG PO TABS
5.0000 mg | ORAL_TABLET | ORAL | Status: DC | PRN
Start: 2022-12-10 — End: 2022-12-12

## 2022-12-10 MED ORDER — ONDANSETRON HCL 4 MG/2ML IJ SOLN
4.0000 mg | Freq: Three times a day (TID) | INTRAMUSCULAR | Status: DC | PRN
Start: 2022-12-10 — End: 2022-12-12

## 2022-12-10 MED ORDER — ALUM & MAG HYDROXIDE-SIMETH 200-200-20 MG/5ML PO SUSP
30.0000 mL | Freq: Four times a day (QID) | ORAL | Status: DC | PRN
Start: 2022-12-10 — End: 2022-12-10

## 2022-12-10 MED ORDER — ALUM & MAG HYDROXIDE-SIMETH 200-200-20 MG/5ML PO SUSP
30.0000 mL | Freq: Four times a day (QID) | ORAL | Status: DC | PRN
Start: 2022-12-10 — End: 2022-12-12

## 2022-12-10 MED ORDER — STERILE WATER FOR INJECTION IJ/IV SOLN (WRAP)
2.0000 g | Freq: Once | INTRAMUSCULAR | Status: DC | PRN
Start: 2022-12-10 — End: 2022-12-10

## 2022-12-10 MED ORDER — PROCHLORPERAZINE EDISYLATE 10 MG/2ML IJ SOLN
10.0000 mg | Freq: Four times a day (QID) | INTRAMUSCULAR | Status: DC | PRN
Start: 2022-12-10 — End: 2022-12-12

## 2022-12-10 NOTE — OB ED Provider Note (Signed)
Patient evaluated by RN in OBED - noted to be 8cm dilated with bulging bag.  Patient fast-tracked to L&D.  Please see H&P.

## 2022-12-10 NOTE — H&P (Signed)
Emergency planning/management officer (Arabic): [place], # [id number]**    OB WSL HISTORY AND PHYSICAL EXAM    Date of Service: 12/10/2022  Patient Name: Karina Mccullough  Attending Physician: Lesle Chris*    Chief Complaint: contractions    History of Presenting Illness:   Ms. Hirschfeld is a 37 y.o.  F [id number] @ [redacted]w[redacted]d (Estimated Date of Delivery: 12/08/22) admitted for active labor.   Today the patient reports feeling increasing intensity of contractions.  She denies vaginal bleeding, denies leakage of fluid, endorses regular contractions, and affirms active fetal movement.    Contributing Medical Conditions:   Common OB Conditions: Anemia -          Past Surgical History:     History reviewed. No pertinent surgical history.    OB History:     OB History   Gravida Para Term Preterm AB Living   4 3 3     3    SAB IAB Ectopic Multiple Live Births                  # Outcome Date GA Lbr Len/2nd Weight Sex Delivery Anes PTL Lv   4 Current            3 Term      Vag-Spont      2 Term      Vag-Spont      1 Term      Vag-Spont          Patient's last menstrual period was 03/03/2022 (approximate).  Estimated Date of Delivery: 12/08/22    Medications / Herbals / OTC:     Current Discharge Medication List        CONTINUE these medications which have NOT CHANGED    Details   Ferrous Sulfate (IRON PO) Take by mouth             Allergies:   No Known Allergies    Psychosocial / Family History:     Social History     Socioeconomic History    Marital status: Married   Tobacco Use    Smoking status: Never    Smokeless tobacco: Never   Vaping Use    Vaping status: Never Used   Substance and Sexual Activity    Alcohol use: Never    Drug use: Never    Sexual activity: Yes     Partners: Male       History reviewed. No pertinent family history.    Prenatal Labs:     Lab Results   Component Value Date    ABORH O POS 10/21/2022    HEPBSAG Non-Reactive 10/21/2022    RPR Nonreactive 10/21/2022    RUBELLAABIGG 4.31 10/21/2022    HCT 31.8 (L) 10/21/2022    HGB 10.0 (L)  10/21/2022    PLT 311 10/21/2022       Lab Results   Component Value Date    GLUCOSECHALL 130 10/21/2022       No results found for: "COVID"     Physical Exam:     Vitals:    12/10/22 1702   BP: 105/74   Pulse: 81       General: alert, uncomfortable appearing with contractions, in no acute distress  Mental Status: oriented to person, place, and time; normal mood, behavior, speech, motor activity, and thought processes  Chest: breathing non-labored and clear to auscultation bilaterally  Heart: regular rate and rhythm, normal S1, S2  Abdomen: soft, gravid, non-tender, non-distended, no rebound, no  guarding , and normal appearing uterine tone  Pelvic: Evaluated by digital exam. Dilation: 8cm Effacement: 90% Station: -3 Consistency: soft Position: anterior Bishop Score:   Document Bishop Score   Extremities: feet normal, normal temperature and sensation, No edema, no redness or tenderness in the calves or thighs, Homan's sign negative bilaterally  FHT: BL 135bpm, mod variability, no accels, +late decels  Toco: ctx q    Ultrasound:  Bedside US c/w: cephalic    Assessment:   Patient is a 37 y.o. [id number] @ [redacted]w[redacted]d with:  Patient Active Problem List   Diagnosis    [redacted] weeks gestation of pregnancy       Plan:   L&D evaluation in the setting of active labor.  With confirmation of cephalic presentation, admit for active expectant management for vaginal delivery.  -- General OB and blood transfusion informed consents reviewed and signed with the patient, including potential for an assisted vaginal delivery, hemorrhage, atony necessitating uterotonics, cesarean section, constrictive suture, blood transfusion, and at last resort, a hysterectomy.  -- Pertinent labs and IVF  -- Continuous EFM and TOCO  -- Anesthesia consult for epidural   -- Patient may have if/when desired   -- Fentanyl order available PRN for interval pain relief  -- PCN for GBS ppx  -- Routine peripartum care   -- Patient prepared for maternal  repositioning/spinning babies to encourage fetal rotation and descent

## 2022-12-10 NOTE — Progress Notes (Signed)
Patient admitted to [facility]. Arabic video interpreter used for assessment and admission education. FOB at bedside -- speaks some english but plans to head home for the evening. Lochia is within normal limits with an initial trickle that resolved with light fundal massage. Vital signs are stable. Plan of care reviewed this shift.  Admission teaching completed with patient/visitors. Patient educated on pain medication, side effects, and pain control. Patient instructed to report heavy bleeding and blood clots. Patient and visitors were oriented to the room and educated on safety information, fall prevention, ID bands, baby sensor, showering, toileting, bulb suction, emergency cord, hand hygiene, and crib safety. Patient verbalized understanding and signed the safety paperwork. Patient encouraged to breast feed baby every 2-3 hours or on demand and do skin to skin. Patient was encouraged to drink water and to call RN before getting out of bed.    FOB involved: Yes   ID bands matched: Yes   Breastfeeding planned: Yes

## 2022-12-11 ENCOUNTER — Other Ambulatory Visit (HOSPITAL_BASED_OUTPATIENT_CLINIC_OR_DEPARTMENT_OTHER): Payer: Self-pay | Admitting: Family Nurse Practitioner

## 2022-12-11 MED ORDER — METHYLERGONOVINE MALEATE 0.2 MG/ML IJ SOLN
INTRAMUSCULAR | Status: DC
Start: 2022-12-11 — End: 2022-12-11
  Filled 2022-12-11: qty 1

## 2022-12-11 MED ORDER — OXYTOCIN-SODIUM CHLORIDE 30-0.9 UT/500ML-% IV SOLN
INTRAVENOUS | Status: DC
Start: 2022-12-11 — End: 2022-12-11
  Filled 2022-12-11: qty 500

## 2022-12-11 MED ORDER — ACETAMINOPHEN 325 MG PO TABS
650.0000 mg | ORAL_TABLET | Freq: Four times a day (QID) | ORAL | 0 refills | Status: DC | PRN
Start: 2022-12-11 — End: 2022-12-11

## 2022-12-11 MED ORDER — DOCUSATE SODIUM 100 MG PO CAPS
100.0000 mg | ORAL_CAPSULE | Freq: Two times a day (BID) | ORAL | 0 refills | Status: DC | PRN
Start: 2022-12-11 — End: 2022-12-11

## 2022-12-11 MED ORDER — TRANEXAMIC ACID-NACL 1000-0.7 MG/100ML-% IV SOLN
INTRAVENOUS | Status: DC
Start: 2022-12-11 — End: 2022-12-11
  Filled 2022-12-11: qty 100

## 2022-12-11 MED ORDER — IBUPROFEN 600 MG PO TABS
600.0000 mg | ORAL_TABLET | Freq: Four times a day (QID) | ORAL | 0 refills | Status: AC | PRN
Start: 2022-12-11 — End: ?

## 2022-12-11 MED ORDER — MISOPROSTOL 200 MCG PO TABS
ORAL_TABLET | ORAL | Status: AC
Start: 2022-12-11 — End: 2022-12-12
  Filled 2022-12-11: qty 4

## 2022-12-11 NOTE — Lactation Note (Addendum)
Initial Lactation Visit:  [id number]  Delivered: 12/10/2022  5:58 PM   Female  via Vaginal, Spontaneous  at [redacted]w[redacted]d    Birth Weight: 8 lb 0.8 oz (3650 g)   Feeding Type:  Breast milk   APGAR: 8  and 9     No Known Allergies  Past Medical History:   Diagnosis Date    Anemia      History reviewed. No pertinent surgical history.    Pt prefers visitor at bedside interpret  Experienced breastfeeding mother. She endorses comfortable latches at this time.   Breastfeeding basics reviewed, patient declined questions or assistance with breastfeeding at this time.  Encouraged to do skin to skin and breast feed with all cues (at least 8-12 times per 24 hours).      Contact number on white board. Patient to call for questions or assistance as needed.    Follow up as needed.

## 2022-12-11 NOTE — Plan of Care (Signed)
Problem: Vaginal/Cesarean Delivery  Goal: Postpartum management of pain/discomfort  Outcome: Progressing  Flowsheets (Taken 12/11/2022 [phone])  Postpartum management of pain/discomfort:   Assess pain using a consistent, developmental/age appropriate pain scale   Assess pain level before and following intervention   Include patient/patient care companion in decisions related to pain management   Offer non-pharmacologic pain management interventions  Goal: Gastrointestinal/Urinary management  Outcome: Progressing  Goal: Perineum will be clean, dry, and intact and without discharge or hematoma  Outcome: Progressing  Flowsheets (Taken 12/11/2022 0938)  Perineum will be clean, dry, and intact and without discharge or hematoma:   Place cold pack on perineum   Provide pericare   Sitz bath PRN as ordered by LIP   Assess for hemorrhoids and provide interventions  Goal: Evidence of positive mother-baby interactions  Outcome: Progressing  Flowsheets (Taken 12/11/2022 0938)  Evidence of positive mother-baby interactions:   Include patient/patient care companion in decisions related to care   Assess emotional status and coping mechanisms   Encourage rooming in and infant feeding on demand   Ensure parent/caregiver provides infant care   Assess parent/caregiver engagement and awareness of infant cues/behavior   Notify LIP and case management if risk factors are identified     Patient is stable and without questions or concerns at this time.  POC reviewed this shift and patient verbalizes understanding and continues to progress. Interpreter used for all education/communication.    Patient pain level within patient stated goal: Yes  Patient breastfeeding infant: Yes  Patient pumping: No  Patient lochia appropriate: Educated patient on voiding every 2-3 hours, pt had a moderate amount of lochia in peripad and on fundal palpation, patient expressed quarter sized clot. Pt stated she had not used the bathroom in 6 hours. Post void, lochia  scant, no clots, fundus midline, firm.  Foley out and voiding: Yes  Abdominal incision clean, dry and well approximated: N/A  Nutrition adequate for discharge: Yes  Patient reports constipation: No  Patient agrees to notify RN of any changes in her condition/status: Yes

## 2022-12-11 NOTE — Discharge Instr - AVS First Page (Signed)
QR CODE FOR DISCHARGE BOOKLET  Open the camera on your cell phone and point at the image. Link will appear on your screen.   Discharge Booklet website: [email or web address]              Resources  Postpartum Support [place]: http://phillips.info/   Call:[phone]  mychart:  [email or web address]  Carseat: carseat.org, saferidenews.com, seatcheck.org,   New Moms Groups:  FindJewelers.cz  Breastfeeding Support: [phone]    [facility]  Silvestre Moment [phone]  Shirlee Latch [phone]  [place] [phone]  Waldon Merl [phone]    Congratulations Karina Mccullough on the birth of your baby!    General Instructions   There are no restrictions on diet.  It is important to maintain adequate hydration to help maximize breast milk production and to keep your bowels moving.  Try to have at least 64 ounces of water a day.    Stairs are allowed, slow and easy.  Plan your trips and minimize the number of times you have to take them.    You may increase your activity as your body allows.     Do not lift more than 30 pounds, especially if you have had a cesarean delivery.    Do not drive for at least two weeks until you are certain you can respond in an emergency situation (ie slam on the brakes if you needed to!).  If you are still taking percocet or oxycodone, you should not drive.    Walking is perfectly fine, avoid strenuous exercise until after your post partum examination.    You should not place anything in your vagina for the first six weeks, this includes tampons, douching or sexual intercourse.    Hygiene   Showering is perfectly fine.  Avoid scrubbing around the area of a tear or incision.     If you have had a cesarean delivery, do not soak in a bath tub until you have been seen for your two week post partum visit.  You may cleanse the incision gently with soap  and water, just be sure to rinse any soap residue from the incision.  Pat the area gently after showering to keep the incision clean and dry.  If you have small pieces of tape (usually steri-strips) along your incision, they may fall off on their own or you can take them off in the shower in 7-10 days. If you have a clear piece of tape on your incision (tegederm), your provider will take it off at the 2 week visit. If it falls off before hand, that's ok.     If you have had a vaginal delivery, it is best to wait two weeks as well to soak in the bath tub.  Sitz baths are recommended when you get home to help keep the vagina clean and allow for more rapid healing.      Breast Care   Breast care includes daily cleansing and wearing a supportive bra.  Expect nipple soreness and generalized breast discomfort.  This is normal and is part of your body becoming used to breast feeding.  If you should develop any excessive pain, redness, or areas that are firm and tender that do not resolve with breast feeding please call our office.    For breast feeding questions, please call [phone] (MILK).  This will put you in contact with the lactation specialist in the hospital.  You can also call the [facility] office to schedule an appointment with our lactation consultant.  If you plan on bottle feeding, wear a tight supportive bra.  You may also use ice packs on the breast but for no longer than 20 minutes at a time.  Do not pump or stimulate the breast as this will signal the body to create more milk.      If you're having trouble with breastfeeding or pumping at home, don't hesitate to reach out to a Science writer [facility]). Your baby's pediatric office may have a Optometrist. You can also check out the following resources:  1) Dr. Antonietta Breach [facility] and pediatrician)): DryBlaze.nl or call [phone]  2) Production designer, theatre/television/film, IBCLC: [address], [place] [address],  [phone]. Www.motheringthemom.com  3) [facility] [place]: [address], [place], [place] [address], Phone: [phone]  [email or web address]     Returning to Normal   Vaginal discharge and bleeding may be present for up to six weeks post partum.  This discharge may be red, dark brown or pink and will gradually decrease.  Use the amount of bleeding that you had in the hospital as a baseline and if you experience bleeding that is heavier and persistent please contact us.  As you increase your activity, you may have an increase in discharge, this is normal.    Menstruation may take up to twelve weeks to return if you are not breastfeeding.  While you are breastfeeding the return to menstruation is unpredictable.    Swelling of the legs is very normal and may actually increase during the first week post partum.  As long as the swelling is the same in both legs this is fine.  If you notice one leg that is markedly different than the other, please contact us.    Up to 80% of new parents will experience "Baby Blues" - feelings of sadness or moodiness (irritability, sleeplessness) within the first few days after having a baby. These symptoms can last up to 2-3 weeks. If symptoms persist beyond this point, it's important to bring up with your provider to explore the potential for a perinatal mood and anxiety disorder. If you are having mood symptoms that you're worried about, give [place] a call. You can read more here: https://www.[email or web address]/expert-commentary/2021/05/the-most-common-perinatal-problem-depression-and-anxiety-but-there-is-hope/    Here are some online resources as well:  MVPSpecials.it  CobrandedAffiliateProgram.com.au (list of resources specific to [place], including list of therapists)      When to Call us   If you had a vaginal delivery please call the office to schedule your six week post partum exam.    If you had a cesarean delivery please  call the office to schedule your two week and six week visit.    Please try to schedule these appointments with the physician you saw for the majority of your pregnancy.    Call the office or on call physician for persistent fever greater than 100 degrees, bleeding that is much heavier than what you had in the hospital (soaking a pad an hour for more than 2 hours), or any unusual pain or redness in the incisional area.    Medications   For mild pain, Ibuprofen (Advil, Motrin, etc) 600 mg and Acetaminophen (Tylenol) 650-'1000mg'$  every six hours is safe.  Stagger these medications for the best pain optimization. For example, schedule Ibuprofen for 6AM, 12PM, 6PM, 12AM. Take the Tylenol at 9AM, 3PM, 9PM, 3AM.     For moderate to severe pain you may take the Oxycodone 5-'10mg'$  (1-2 tablets) that was prescribed for you every 4-6  hours, no more than 12 tablets a day. Oxycodone is constipating so make sure to use a stool softener with it.     If you have allergies to either of these medications, you will be given an alternative, please refer the prescription that you were given.    If you have constipation, you have any options: stool softeners such as colace and pericolace are perfectly safe.  If you need something more effective you can take Miralax (1 packet up to two times a day.  Glycerin suppositories may also be useful if you have hard stools. Make sure to eat a combination of insoluble and soluble fiber rich foods like prunes, greens, lentils, quinoa, berries, etc.    Continue your prenatal vitamin while you are breastfeeding and take an iron supplement if you were directed to at time of discharge to help rebuild any blood you may have lost at delivery.      Discharge Booklet website: http://www.[email or web address]

## 2022-12-11 NOTE — Plan of Care (Signed)
Problem: Vaginal/Cesarean Delivery  Goal: Breasts are soft with nipple integrity intact  Outcome: Progressing  Flowsheets (Taken 12/11/2022 2137)  Breasts are soft with nipple integrity intact:   Perform breast/nipple assessment   Assess and manage engorgement   Ensure proper positioning and latch   Breastfeed and/or pump breasts at least 8-12 times within 24 hours   Provide pharmacologic and non-pharmacologic interventions as needed  Goal: Uterine management  Outcome: Progressing  Flowsheets (Taken 12/11/2022 2137)  Uterine management:   Assess fundus and notify LIP if not firm, midline, or at or below the umbilicus, or if abdomen is abnormally distended   Assess for hemorrhage risk using appropriate screening tool   Patient stable and continues to progress. Breastfeeding for baby. Discussed plan for the shift and safety measures - patient verbalized understanding.

## 2022-12-11 NOTE — Plan of Care (Signed)
Arabic video interpreter used for assessment and education.   Patient is stable and without questions or concerns at this time.  POC reviewed this shift and patient verbalizes understanding and continues to progress.  Patient out of bed x2 with two person assist mobilizing well. Ibuprofen given for pain and patient is aware that Tylenol is available if needed. Denies pain with assessments.   Once independent, RN encouraged patient to attempt to void and to change her pad every 3 hours, use the peri bottle to clean with each trip to the bathroom.    Patient pain level within patient stated goal: Yes  Patient breastfeeding infant: Yes  Patient lochia appropriate: Yes, scant and rubra.   Nutrition adequate for discharge: Yes  Patient reports constipation: No   Patient agrees to notify RN of any changes in her condition/status: Yes     Problem: Vaginal/Cesarean Delivery  Goal: Postpartum management of pain/discomfort  12/11/2022 0430 by Lawana Pai, RN  Outcome: Progressing  Flowsheets (Taken 12/11/2022 0430)  Postpartum management of pain/discomfort:   Assess pain using a consistent, developmental/age appropriate pain scale   Assess pain level before and following intervention   Include patient/patient care companion in decisions related to pain management   Offer non-pharmacologic pain management interventions   Report ineffective pain management to LIP  12/11/2022 0429 by Lawana Pai, RN  Outcome: Progressing  Flowsheets (Taken 12/11/2022 0429)  Postpartum management of pain/discomfort:   Assess pain using a consistent, developmental/age appropriate pain scale   Include patient/patient care companion in decisions related to pain management   Assess pain level before and following intervention   Offer non-pharmacologic pain management interventions   Report ineffective pain management to LIP     Problem: Vaginal/Cesarean Delivery  Goal: Uterine management  12/11/2022 0430 by Lawana Pai, RN  Outcome:  Progressing  Flowsheets (Taken 12/11/2022 0430)  Uterine management:   Assess for hemorrhage risk using appropriate screening tool   Assess fundus and notify LIP if not firm, midline, or at or below the umbilicus, or if abdomen is abnormally distended  12/11/2022 0429 by Lawana Pai, RN  Outcome: Progressing  Flowsheets (Taken 12/11/2022 0429)  Uterine management:   Assess fundus and notify LIP if not firm, midline, or at or below the umbilicus, or if abdomen is abnormally distended   Assess for hemorrhage risk using appropriate screening tool     Problem: Vaginal/Cesarean Delivery  Goal: Perineum will be clean, dry, and intact and without discharge or hematoma  12/11/2022 0430 by Lawana Pai, RN  Outcome: Progressing  Flowsheets (Taken 12/11/2022 0430)  Perineum will be clean, dry, and intact and without discharge or hematoma:   Monitor wound/incision for signs of infections   Place cold pack on perineum   Provide pericare   Assess for hemorrhoids and provide interventions  12/11/2022 0429 by Lawana Pai, RN  Outcome: Progressing

## 2022-12-11 NOTE — Discharge Summary (Signed)
Obstetrical Discharge Form    Primary OB Clinician: Sarita Bottom, *    [facility]: Estimated Date of Delivery: 12/08/22    Gestational Age: [redacted]w[redacted]d    Antepartum complications:   AMA  Transfer of Care from [place]- late prenatal care  Anemia         Hospital Course:     Karina Mccullough is a 37 y.o. admitted to [facility] on 12/10/2022 for [redacted] weeks gestation of pregnancy.  She delivered at [redacted]w[redacted]d gestation via Normal spontaneous vaginal delivery on 12/10/2022  at 5:58 PM .  For further details regarding her delivery, please see the delivery note.     Delivered By: Consuela Mimes H     Delivery Type: Vaginal, Spontaneous     Baby: Liveborn  Female , Apgars 8  (1 min), 9  (5 min) weight 8 lb 0.8 oz (3650 g)     Anesthesia: None    Laceration: 1st     Episiotomy: None     [name]'s hospital course was significant for:  None     At the time of discharge to home, the patient's vital signs were stable and she was afebrile. The patient is feeding her baby via: Feeding Type: Breast milk (12/10/22 2250)     Discharge Medications:     Current Discharge Medication List        START taking these medications    Details   acetaminophen (TYLENOL) 325 MG tablet Take 2 tablets (650 mg) by mouth every 6 (six) hours as needed for Pain  Qty: 30 tablet, Refills: 0      docusate sodium (COLACE) 100 MG capsule Take 1 capsule (100 mg) by mouth 2 (two) times daily as needed for Constipation  Qty: 30 capsule, Refills: 0      ibuprofen (ADVIL) 600 MG tablet Take 1 tablet (600 mg) by mouth every 6 (six) hours as needed for Pain  Qty: 30 tablet, Refills: 0           STOP taking these medications       Ferrous Sulfate (IRON PO)              Anticipated Discharge Date/Time: 12/12/2022        Plan:     [place] Instructions:   The patient has been given a discharge instruction sheet which includes precautions for activity level, dietary precautions, expectations, potential complications that require contact with the office and discharge  prescriptions. These instructions were reviewed with the patient. All questions were answered.    Follow-up appointment in 6 weeks for postpartum check    Janeann Merl, FNP-BC  [facility]

## 2022-12-11 NOTE — Progress Notes (Signed)
[facility] PP Progress Note    Michaelene Niala Stcharles is a 37 y.o. [id number] PPD#1 s/p NSVD at [redacted]w[redacted]d.     Pregnancy complicated by:   AMA  Transfer of Care from [place]- late prenatal care  Anemic    Interpreter ID # [id number]    S: Feeling well this am, no complaints. Bonding w/ baby boy " Hassen". Voiding freely, ambulating, tolerating PO. Lochia normal. Pain well-controlled. Breast feeding. Denies fever, chills, headache, visual changes, lightheadedness, dizziness, SOB, chest pain, palpitations, N/V.    O:  Temp:  [97.6 F (36.4 C)-98.4 F (36.9 C)] 98.4 F (36.9 C)  Heart Rate:  [55-81] 55  Resp Rate:  [16-19] 18  BP: (95-106)/(51-74) 96/58  General appearance - alert, well appearing, and in no distress. Oriented to person, place, and time   Mental status - Normal mood, behavior, speech, dress and thought processes  Abd: soft, NT, fundus firm and below the umbilicus  Ext: No edema, no calf tenderness    EBL: 150cc  Laceration: 1st     A/P: Elynor Fujie Dickison is a 37 y.o. [id number] PPD#1 s/p NSVD at [redacted]w[redacted]d. Patient remains hemodynamically stable and is recovering well following delivery.       Post partum  - O Pos Ishmael Holter 4.31 (05/06 1318)/GBS Positive (06/11 0000)  - Vaccinations: [x]  tdap  - VSS, afebrile, routine monitoring  - Diet: Regular diet as tolerated  - reviewed peri care and vaginal rest x6 weeks  - Breast feeding; inpatient lactation consult in place, reviewed s/sx of mastitis  - Contraception: discuss with primary OB at 6 week appointment    2.  Circumcision  - patient desires circumcision of newborn  - procedure and post care explained; risk reviewed  - consent obtained; circ to be completed by MD prior to discharge      Disposition: Anticipate discharge to home tomorrow; follow up for routine post partum exam in 6 weeks or PRN for acute issues    All questions and concerns addressed at this time.    Janeann Merl, FNP,  [facility]  [phone]  12/11/2022

## 2022-12-12 NOTE — Plan of Care (Signed)
Patient discharged home this evening. Patient VSS. Lochia and fundus WNL. Breast feeding. Ambulatory. Pain is being managed with medication. POC and safety was reviewed during shift using interpretor. Discharge education given to patient. Patient knows reasons to call OB and when to follow up. PPD education gone over. No further questions at this time.     Problem: Vaginal/Cesarean Delivery  Goal: Postpartum management of pain/discomfort  Outcome: Completed  Goal: Breasts are soft with nipple integrity intact  Outcome: Completed  Goal: Gastrointestinal/Urinary management  Outcome: Completed  Goal: Uterine management  Outcome: Completed  Goal: Perineum will be clean, dry, and intact and without discharge or hematoma  Outcome: Completed  Goal: Evidence of positive mother-baby interactions  Outcome: Completed     Problem: Pain interferes with ability to perform ADL  Goal: Pain at adequate level as identified by patient  Outcome: Completed     Problem: Side Effects from Pain Analgesia  Goal: Patient will experience minimal side effects of analgesic therapy  Outcome: Completed

## 2022-12-16 ENCOUNTER — Encounter (HOSPITAL_BASED_OUTPATIENT_CLINIC_OR_DEPARTMENT_OTHER): Payer: Self-pay

## 2022-12-16 ENCOUNTER — Inpatient Hospital Stay (HOSPITAL_BASED_OUTPATIENT_CLINIC_OR_DEPARTMENT_OTHER): Admit: 2022-12-16 | Payer: No Typology Code available for payment source | Admitting: Obstetrics & Gynecology

## 2022-12-16 SURGERY — Surgical Case
Anesthesia: Spinal
